# Patient Record
Sex: Female | Born: 1975 | Race: Black or African American | Hispanic: No | Marital: Single | Smoking: Current every day smoker
Health system: Southern US, Community
[De-identification: ages and names within clinical notes are randomized; demographics above are authoritative.]

## PROBLEM LIST (undated history)

## (undated) DIAGNOSIS — J449 Chronic obstructive pulmonary disease, unspecified: Secondary | ICD-10-CM

## (undated) DIAGNOSIS — E119 Type 2 diabetes mellitus without complications: Secondary | ICD-10-CM

---

## 2017-09-26 ENCOUNTER — Encounter (HOSPITAL_BASED_OUTPATIENT_CLINIC_OR_DEPARTMENT_OTHER): Payer: Self-pay

## 2017-09-26 ENCOUNTER — Emergency Department (HOSPITAL_BASED_OUTPATIENT_CLINIC_OR_DEPARTMENT_OTHER)
Admission: EM | Admit: 2017-09-26 | Discharge: 2017-09-26 | Disposition: A | Discharge status: Home / Self Care | Payer: Self-pay | Attending: Emergency Medicine | Admitting: Emergency Medicine

## 2017-09-26 ENCOUNTER — Emergency Department (HOSPITAL_BASED_OUTPATIENT_CLINIC_OR_DEPARTMENT_OTHER): Payer: Self-pay

## 2017-09-26 ENCOUNTER — Other Ambulatory Visit: Payer: Self-pay

## 2017-09-26 DIAGNOSIS — R05 Cough: Secondary | ICD-10-CM | POA: Insufficient documentation

## 2017-09-26 DIAGNOSIS — J209 Acute bronchitis, unspecified: Secondary | ICD-10-CM | POA: Insufficient documentation

## 2017-09-26 DIAGNOSIS — R059 Cough, unspecified: Secondary | ICD-10-CM

## 2017-09-26 DIAGNOSIS — F17200 Nicotine dependence, unspecified, uncomplicated: Secondary | ICD-10-CM | POA: Insufficient documentation

## 2017-09-26 MED ORDER — AZITHROMYCIN 250 MG PO TABS: 250.0000 mg | ORAL_TABLET | Freq: Every day | ORAL | 0 refills | Status: DC

## 2017-09-26 MED ORDER — PREDNISONE 10 MG PO TABS
60.0000 mg | ORAL_TABLET | Freq: Once | ORAL | Status: AC
  Administered 2017-09-26: 60 mg via ORAL
  Filled 2017-09-26: qty 1

## 2017-09-26 MED ORDER — IPRATROPIUM-ALBUTEROL 0.5-2.5 (3) MG/3ML IN SOLN
3.0000 mL | Freq: Once | RESPIRATORY_TRACT | Status: AC
  Administered 2017-09-26: 3 mL via RESPIRATORY_TRACT
  Filled 2017-09-26: qty 3

## 2017-09-26 MED ORDER — ALBUTEROL SULFATE (2.5 MG/3ML) 0.083% IN NEBU
5.0000 mg | INHALATION_SOLUTION | Freq: Once | RESPIRATORY_TRACT | Status: AC
  Administered 2017-09-26: 5 mg via RESPIRATORY_TRACT
  Filled 2017-09-26: qty 6

## 2017-09-26 MED ORDER — ALBUTEROL SULFATE HFA 108 (90 BASE) MCG/ACT IN AERS
2.0000 | INHALATION_SPRAY | Freq: Once | RESPIRATORY_TRACT | Status: AC
  Administered 2017-09-26: 2 via RESPIRATORY_TRACT
  Filled 2017-09-26: qty 6.7

## 2017-09-26 MED ORDER — PREDNISONE 10 MG (21) PO TBPK: ORAL_TABLET | ORAL | 0 refills | Status: DC

## 2017-09-26 NOTE — ED Provider Notes (Signed)
[facility] EMERGENCY DEPARTMENT Provider Note   CSN: [id number] Arrival date & time: 09/26/17  2015     History   Chief Complaint Chief Complaint  Patient presents with  . Cough    HPI Grace Collier is a 42 y.o. female.  HPI   Grace Collier is a 42 y.o. female, patient with no pertinent past medical history, presenting to the ED with cough beginning about 6 days ago.  Accompanied by intermittent shortness of breath and subjective fever.  Has intermittently taken Mucinex, but without improvement.  She does admit to poor hydration. Denies chest pain, abdominal pain, N/V/D, or any other complaints.   History reviewed. No pertinent past medical history.  There are no active problems to display for this patient.   History reviewed. No pertinent surgical history.   OB History   None      Home Medications    Prior to Admission medications   Medication Sig Start Date End Date Taking? Authorizing Provider  azithromycin (ZITHROMAX) 250 MG tablet Take 1 tablet (250 mg total) by mouth daily. Take first 2 tablets together, then 1 every day until finished. 09/26/17   Joy, Shawn C, PA-C  predniSONE (STERAPRED UNI-PAK 21 TAB) 10 MG (21) TBPK tablet Take 6 tabs (60mg ) on day 1, 5 tabs (50mg ) on day 2, 4 tabs (40mg ) on day 3, 3 tabs (30mg ) on day 4, 2 tabs (20mg ) on day 5, and 1 tab (10mg ) on day 6. 09/26/17   Joy, Hillard Danker, PA-C    Family History No family history on file.  Social History Social History   Tobacco Use  . Smoking status: Current Every Day Smoker  . Smokeless tobacco: Never Used  Substance Use Topics  . Alcohol use: Never    Frequency: Never  . Drug use: Not Currently     Allergies   Patient has no known allergies.   Review of Systems Review of Systems  Constitutional: Positive for fever (subjective).  HENT: Positive for congestion.   Respiratory: Positive for cough and shortness of breath.   Cardiovascular: Negative for chest pain.    Gastrointestinal: Negative for abdominal pain, diarrhea, nausea and vomiting.  Neurological: Negative for dizziness and light-headedness.  All other systems reviewed and are negative.    Physical Exam Updated Vital Signs BP (!) 168/106 (BP Location: Left Arm)   Pulse (!) 102   Temp 98.6 F (37 C) (Oral)   Resp (!) 24   Ht 5\' 2"  (1.575 m)   Wt 82.1 kg (181 lb)   LMP  (LMP Unknown)   SpO2 93%   BMI 33.11 kg/m   Physical Exam  Constitutional: She appears well-developed and well-nourished. No distress.  HENT:  Head: Normocephalic and atraumatic.  Eyes: Conjunctivae are normal.  Neck: Neck supple.  Cardiovascular: Normal rate, regular rhythm, normal heart sounds and intact distal pulses.  Pulmonary/Chest: She has decreased breath sounds. She has wheezes (global, expiratory).  No increased work of breathing.  Speaks full sentences without difficulty.  Abdominal: Soft. There is no tenderness. There is no guarding.  Musculoskeletal: She exhibits no edema.  Lymphadenopathy:    She has no cervical adenopathy.  Neurological: She is alert.  Skin: Skin is warm and dry. She is not diaphoretic.  Psychiatric: She has a normal mood and affect. Her behavior is normal.  Nursing note and vitals reviewed.    ED Treatments / Results  Labs (all labs ordered are listed, but only abnormal results are displayed) Labs Reviewed -  No data to display  EKG None  Radiology Dg Chest 2 View  Result Date: 09/26/2017 CLINICAL DATA:  Cough with shortness of breath EXAM: CHEST - 2 VIEW COMPARISON:  None. FINDINGS: No acute airspace disease or effusion. Mild cardiomegaly. No pneumothorax. IMPRESSION: No active cardiopulmonary disease.  Mild cardiomegaly. Electronically Signed   By: Jasmine PangKim  Fujinaga M.D.   On: 09/26/2017 22:13    Procedures Procedures (including critical care time)  Medications Ordered in ED Medications  albuterol (PROVENTIL) (2.5 MG/3ML) 0.083% nebulizer solution 5 mg (5 mg  Nebulization Given 09/26/17 2037)  predniSONE (DELTASONE) tablet 60 mg (60 mg Oral Given 09/26/17 2101)  ipratropium-albuterol (DUONEB) 0.5-2.5 (3) MG/3ML nebulizer solution 3 mL (3 mLs Nebulization Given 09/26/17 2102)  albuterol (PROVENTIL HFA;VENTOLIN HFA) 108 (90 Base) MCG/ACT inhaler 2 puff (2 puffs Inhalation Given 09/26/17 2309)     Initial Impression / Assessment and Plan / ED Course  I have reviewed the triage vital signs and the nursing notes.  Pertinent labs & imaging results that were available during my care of the patient were reviewed by me and considered in my medical decision making (see chart for details).  Clinical Course as of Sep 28 26  Fri Sep 26, 2017  2124 Patient states she feels much better.  SPO2 noted to be 95% on room air.   [SJ]  2247 Patient states she continues to feel much better.  SPO2 99% on room air.  No increased work of breathing.  No wheezes on lung exam.   [SJ]    Clinical Course User Index [SJ] Joy, Shawn C, PA-C    Patient presents with cough and intermittent shortness of breath.  No acute abnormality on chest x-ray.  Patient showed significant improvement during ED course.  It should be noted that despite the reported SPO2 readings in the low 90s, patient showed no signs of distress.  Patient ambulated without shortness of breath or signs of distress. The patient was given instructions for home care as well as return precautions. Patient voices understanding of these instructions, accepts the plan, and is comfortable with discharge.  Final Clinical Impressions(s) / ED Diagnoses   Final diagnoses:  Cough  Acute bronchitis, unspecified organism    ED Discharge Orders        Ordered    predniSONE (STERAPRED UNI-PAK 21 TAB) 10 MG (21) TBPK tablet     09/26/17 2254    azithromycin (ZITHROMAX) 250 MG tablet  Daily     09/26/17 2254       Anselm PancoastJoy, Shawn C, PA-C 09/27/17 0030    Charlynne PanderYao, David Hsienta, MD 09/27/17 1504

## 2017-09-26 NOTE — ED Triage Notes (Signed)
C/o cough x 6 days-NAD-steady gait

## 2017-09-26 NOTE — Discharge Instructions (Signed)
Please take all of your antibiotics until finished!   You may develop abdominal discomfort or diarrhea from the antibiotic.  You may help offset this with probiotics which you can buy or get in yogurt. Do not eat or take the probiotics until 2 hours after your antibiotic.   Hand washing: Wash your hands throughout the day, but especially before and after touching the face, using the restroom, sneezing, coughing, or touching surfaces that have been coughed or sneezed upon. Hydration: Symptoms will be intensified and complicated by dehydration. Dehydration can also extend the duration of symptoms. Drink plenty of fluids and get plenty of rest. You should be drinking at least half a liter of water an hour to stay hydrated. Electrolyte drinks (ex. Gatorade, Powerade, Pedialyte) are also encouraged. You should be drinking enough fluids to make your urine light yellow, almost clear. If this is not the case, you are not drinking enough water. Please note that some of the treatments indicated below will not be effective if you are not adequately hydrated. Pain or fever: Ibuprofen, Naproxen, or Tylenol for pain or fever.  Albuterol: May use the albuterol as needed for instances of shortness of breath. Prednisone: Take the prednisone, as directed, in its entirety. Zyrtec or Claritin: May add these medication daily to control underlying symptoms of congestion, sneezing, and other signs of allergies. Flonase: Use this medication, as directed, for nasal and sinus congestion. Congestion: Plain Mucinex may help relieve congestion. Saline sinus rinses and saline nasal sprays may also help relieve congestion. Follow up: Follow up with a primary care provider, as needed, for any future management of this issue. Return: Return to the ED for any worsening symptoms.

## 2017-09-26 NOTE — ED Notes (Signed)
Pt states she has had cold symptoms that started last Saturday and have progressively gotten worse  Pt states she has taken mucinex and theraflu without relief

## 2017-11-18 DIAGNOSIS — I10 Essential (primary) hypertension: Secondary | ICD-10-CM | POA: Diagnosis present

## 2017-11-19 DIAGNOSIS — Z87891 Personal history of nicotine dependence: Secondary | ICD-10-CM

## 2017-11-19 DIAGNOSIS — J449 Chronic obstructive pulmonary disease, unspecified: Secondary | ICD-10-CM | POA: Diagnosis present

## 2017-11-20 DIAGNOSIS — R911 Solitary pulmonary nodule: Secondary | ICD-10-CM | POA: Diagnosis present

## 2017-12-10 DIAGNOSIS — E119 Type 2 diabetes mellitus without complications: Secondary | ICD-10-CM

## 2019-09-17 ENCOUNTER — Emergency Department (HOSPITAL_BASED_OUTPATIENT_CLINIC_OR_DEPARTMENT_OTHER): Payer: Self-pay

## 2019-09-17 ENCOUNTER — Other Ambulatory Visit: Payer: Self-pay

## 2019-09-17 ENCOUNTER — Inpatient Hospital Stay (HOSPITAL_COMMUNITY): Payer: Self-pay

## 2019-09-17 ENCOUNTER — Encounter (HOSPITAL_BASED_OUTPATIENT_CLINIC_OR_DEPARTMENT_OTHER): Payer: Self-pay

## 2019-09-17 ENCOUNTER — Inpatient Hospital Stay (HOSPITAL_BASED_OUTPATIENT_CLINIC_OR_DEPARTMENT_OTHER)
Admission: AD | Admit: 2019-09-17 | Discharge: 2019-09-20 | DRG: 812 | Disposition: A | Discharge status: Home / Self Care | Payer: Self-pay | Attending: Internal Medicine | Admitting: Internal Medicine

## 2019-09-17 DIAGNOSIS — K5900 Constipation, unspecified: Secondary | ICD-10-CM | POA: Diagnosis present

## 2019-09-17 DIAGNOSIS — D509 Iron deficiency anemia, unspecified: Principal | ICD-10-CM | POA: Diagnosis present

## 2019-09-17 DIAGNOSIS — Z87891 Personal history of nicotine dependence: Secondary | ICD-10-CM

## 2019-09-17 DIAGNOSIS — D649 Anemia, unspecified: Secondary | ICD-10-CM | POA: Diagnosis present

## 2019-09-17 DIAGNOSIS — Z20822 Contact with and (suspected) exposure to covid-19: Secondary | ICD-10-CM | POA: Diagnosis present

## 2019-09-17 DIAGNOSIS — Z79899 Other long term (current) drug therapy: Secondary | ICD-10-CM

## 2019-09-17 DIAGNOSIS — I10 Essential (primary) hypertension: Secondary | ICD-10-CM | POA: Diagnosis present

## 2019-09-17 DIAGNOSIS — F17200 Nicotine dependence, unspecified, uncomplicated: Secondary | ICD-10-CM | POA: Diagnosis present

## 2019-09-17 DIAGNOSIS — K8020 Calculus of gallbladder without cholecystitis without obstruction: Secondary | ICD-10-CM | POA: Diagnosis present

## 2019-09-17 DIAGNOSIS — R140 Abdominal distension (gaseous): Secondary | ICD-10-CM

## 2019-09-17 DIAGNOSIS — E119 Type 2 diabetes mellitus without complications: Secondary | ICD-10-CM

## 2019-09-17 DIAGNOSIS — R911 Solitary pulmonary nodule: Secondary | ICD-10-CM | POA: Diagnosis present

## 2019-09-17 DIAGNOSIS — J449 Chronic obstructive pulmonary disease, unspecified: Secondary | ICD-10-CM | POA: Diagnosis present

## 2019-09-17 DIAGNOSIS — R1010 Upper abdominal pain, unspecified: Secondary | ICD-10-CM

## 2019-09-17 DIAGNOSIS — J90 Pleural effusion, not elsewhere classified: Secondary | ICD-10-CM

## 2019-09-17 DIAGNOSIS — D696 Thrombocytopenia, unspecified: Secondary | ICD-10-CM | POA: Diagnosis present

## 2019-09-17 HISTORY — DX: Chronic obstructive pulmonary disease, unspecified: J44.9

## 2019-09-17 HISTORY — DX: Type 2 diabetes mellitus without complications: E11.9

## 2019-09-17 LAB — PREPARE RBC (CROSSMATCH)

## 2019-09-17 LAB — COMPREHENSIVE METABOLIC PANEL
ALT: 26 U/L (ref 0–44)
AST: 26 U/L (ref 15–41)
Albumin: 3.5 g/dL (ref 3.5–5.0)
Alkaline Phosphatase: 66 U/L (ref 38–126)
Anion gap: 8 (ref 5–15)
BUN: 7 mg/dL (ref 6–20)
CO2: 25 mmol/L (ref 22–32)
Calcium: 8.8 mg/dL — ABNORMAL LOW (ref 8.9–10.3)
Chloride: 106 mmol/L (ref 98–111)
Creatinine, Ser: 0.86 mg/dL (ref 0.44–1.00)
GFR calc Af Amer: >60 mL/min (ref >60)
GFR calc non Af Amer: >60 mL/min (ref >60)
Glucose, Bld: 206 mg/dL — ABNORMAL HIGH (ref 70–99)
Potassium: 3.6 mmol/L (ref 3.5–5.1)
Sodium: 139 mmol/L (ref 135–145)
Total Bilirubin: 0.4 mg/dL (ref 0.3–1.2)
Total Protein: 6.6 g/dL (ref 6.5–8.1)

## 2019-09-17 LAB — FOLATE: Folate: 7.6 ng/mL (ref >5.9)

## 2019-09-17 LAB — CBC
HCT: 24.7 % — ABNORMAL LOW (ref 36.0–46.0)
Hemoglobin: 6.7 g/dL — CL (ref 12.0–15.0)
MCH: 19.3 pg — ABNORMAL LOW (ref 26.0–34.0)
MCHC: 27.1 g/dL — ABNORMAL LOW (ref 30.0–36.0)
MCV: 71.2 fL — ABNORMAL LOW (ref 80.0–100.0)
Platelets: 148 10*3/uL — ABNORMAL LOW (ref 150–400)
RBC: 3.47 MIL/uL — ABNORMAL LOW (ref 3.87–5.11)
RDW: 19.1 % — ABNORMAL HIGH (ref 11.5–15.5)
WBC: 5.1 10*3/uL (ref 4.0–10.5)
nRBC: 0 % (ref 0.0–0.2)

## 2019-09-17 LAB — IRON AND TIBC
Iron: 19 ug/dL — ABNORMAL LOW (ref 28–170)
Saturation Ratios: 4 % — ABNORMAL LOW (ref 10.4–31.8)
TIBC: 534 ug/dL — ABNORMAL HIGH (ref 250–450)
UIBC: 515 ug/dL

## 2019-09-17 LAB — SARS CORONAVIRUS 2 BY RT PCR (HOSPITAL ORDER, PERFORMED IN ~~LOC~~ HOSPITAL LAB): SARS Coronavirus 2: NEGATIVE

## 2019-09-17 LAB — URINALYSIS, ROUTINE W REFLEX MICROSCOPIC
Bilirubin Urine: NEGATIVE
Glucose, UA: 100 mg/dL — AB
Hgb urine dipstick: NEGATIVE
Ketones, ur: NEGATIVE mg/dL
Leukocytes,Ua: NEGATIVE
Nitrite: NEGATIVE
Protein, ur: NEGATIVE mg/dL
Specific Gravity, Urine: 1.02 (ref 1.005–1.030)
pH: 6 (ref 5.0–8.0)

## 2019-09-17 LAB — GLUCOSE, CAPILLARY: Glucose-Capillary: 139 mg/dL — ABNORMAL HIGH (ref 70–99)

## 2019-09-17 LAB — RETICULOCYTES
Immature Retic Fract: 12.9 % (ref 2.3–15.9)
RBC.: 3.36 MIL/uL — ABNORMAL LOW (ref 3.87–5.11)
Retic Count, Absolute: 56.8 10*3/uL (ref 19.0–186.0)
Retic Ct Pct: 1.7 % (ref 0.4–3.1)

## 2019-09-17 LAB — TSH: TSH: 0.741 u[IU]/mL (ref 0.350–4.500)

## 2019-09-17 LAB — FERRITIN: Ferritin: 3 ng/mL — ABNORMAL LOW (ref 11–307)

## 2019-09-17 LAB — VITAMIN B12: Vitamin B-12: 355 pg/mL (ref 180–914)

## 2019-09-17 LAB — HEMOGLOBIN A1C
Hgb A1c MFr Bld: 6.7 % — ABNORMAL HIGH (ref 4.8–5.6)
Mean Plasma Glucose: 145.59 mg/dL

## 2019-09-17 LAB — LIPASE, BLOOD: Lipase: 49 U/L (ref 11–51)

## 2019-09-17 LAB — PREGNANCY, URINE: Preg Test, Ur: NEGATIVE

## 2019-09-17 LAB — OCCULT BLOOD X 1 CARD TO LAB, STOOL: Fecal Occult Bld: NEGATIVE

## 2019-09-17 MED ORDER — THIAMINE HCL 100 MG PO TABS
100.0000 mg | ORAL_TABLET | Freq: Every day | ORAL | Status: DC
  Administered 2019-09-17 – 2019-09-20 (×3): 100 mg via ORAL
  Filled 2019-09-17 (×3): qty 1

## 2019-09-17 MED ORDER — SODIUM CHLORIDE 0.9% FLUSH
3.0000 mL | INTRAVENOUS | Status: DC | PRN
  Administered 2019-09-18: 3 mL via INTRAVENOUS

## 2019-09-17 MED ORDER — SODIUM CHLORIDE 0.9% FLUSH
3.0000 mL | Freq: Two times a day (BID) | INTRAVENOUS | Status: DC
  Administered 2019-09-17 – 2019-09-20 (×3): 3 mL via INTRAVENOUS

## 2019-09-17 MED ORDER — PROMETHAZINE HCL 25 MG PO TABS: 12.5000 mg | ORAL_TABLET | Freq: Four times a day (QID) | ORAL | Status: DC | PRN

## 2019-09-17 MED ORDER — METOPROLOL TARTRATE 5 MG/5ML IV SOLN: 5.0000 mg | Freq: Four times a day (QID) | INTRAVENOUS | Status: DC | PRN

## 2019-09-17 MED ORDER — ACETAMINOPHEN 325 MG PO TABS
650.0000 mg | ORAL_TABLET | Freq: Once | ORAL | Status: AC
  Administered 2019-09-18: 650 mg via ORAL
  Filled 2019-09-17: qty 2

## 2019-09-17 MED ORDER — OXYCODONE HCL 5 MG PO TABS
5.0000 mg | ORAL_TABLET | ORAL | Status: DC | PRN
  Administered 2019-09-18 – 2019-09-20 (×4): 5 mg via ORAL
  Filled 2019-09-17 (×4): qty 1

## 2019-09-17 MED ORDER — ACETAMINOPHEN 325 MG PO TABS: 650.0000 mg | ORAL_TABLET | Freq: Four times a day (QID) | ORAL | Status: DC | PRN

## 2019-09-17 MED ORDER — SENNOSIDES-DOCUSATE SODIUM 8.6-50 MG PO TABS: 1.0000 | ORAL_TABLET | Freq: Every evening | ORAL | Status: DC | PRN

## 2019-09-17 MED ORDER — ADULT MULTIVITAMIN W/MINERALS CH
1.0000 | ORAL_TABLET | Freq: Every day | ORAL | Status: DC
  Administered 2019-09-17 – 2019-09-20 (×3): 1 via ORAL
  Filled 2019-09-17 (×3): qty 1

## 2019-09-17 MED ORDER — NICOTINE 14 MG/24HR TD PT24
14.0000 mg | MEDICATED_PATCH | Freq: Every day | TRANSDERMAL | Status: DC
  Administered 2019-09-17 – 2019-09-20 (×4): 14 mg via TRANSDERMAL
  Filled 2019-09-17 (×4): qty 1

## 2019-09-17 MED ORDER — INSULIN ASPART 100 UNIT/ML ~~LOC~~ SOLN: 0.0000 [IU] | Freq: Every day | SUBCUTANEOUS | Status: DC

## 2019-09-17 MED ORDER — INSULIN ASPART 100 UNIT/ML ~~LOC~~ SOLN
0.0000 [IU] | Freq: Three times a day (TID) | SUBCUTANEOUS | Status: DC
  Administered 2019-09-20: 4 [IU] via SUBCUTANEOUS

## 2019-09-17 MED ORDER — INSULIN ASPART 100 UNIT/ML ~~LOC~~ SOLN
4.0000 [IU] | Freq: Three times a day (TID) | SUBCUTANEOUS | Status: DC
  Administered 2019-09-20 (×2): 4 [IU] via SUBCUTANEOUS

## 2019-09-17 MED ORDER — FOLIC ACID 1 MG PO TABS
1.0000 mg | ORAL_TABLET | Freq: Every day | ORAL | Status: DC
  Administered 2019-09-17 – 2019-09-20 (×3): 1 mg via ORAL
  Filled 2019-09-17 (×3): qty 1

## 2019-09-17 MED ORDER — SODIUM CHLORIDE 0.9 % IV SOLN
250.0000 mL | INTRAVENOUS | Status: DC | PRN
  Administered 2019-09-18: 100 mL via INTRAVENOUS
  Administered 2019-09-18: 250 mL via INTRAVENOUS

## 2019-09-17 MED ORDER — PANTOPRAZOLE SODIUM 40 MG IV SOLR
40.0000 mg | Freq: Once | INTRAVENOUS | Status: AC
  Administered 2019-09-17: 40 mg via INTRAVENOUS
  Filled 2019-09-17: qty 40

## 2019-09-17 MED ORDER — DIPHENHYDRAMINE HCL 25 MG PO CAPS
25.0000 mg | ORAL_CAPSULE | Freq: Once | ORAL | Status: AC
  Administered 2019-09-18: 25 mg via ORAL
  Filled 2019-09-17: qty 1

## 2019-09-17 MED ORDER — ACETAMINOPHEN 650 MG RE SUPP: 650.0000 mg | Freq: Four times a day (QID) | RECTAL | Status: DC | PRN

## 2019-09-17 MED ORDER — TRAZODONE HCL 50 MG PO TABS: 25.0000 mg | ORAL_TABLET | Freq: Every evening | ORAL | Status: DC | PRN

## 2019-09-17 MED ORDER — INSULIN DETEMIR 100 UNIT/ML ~~LOC~~ SOLN
5.0000 [IU] | Freq: Every day | SUBCUTANEOUS | Status: DC
  Administered 2019-09-17 – 2019-09-19 (×3): 5 [IU] via SUBCUTANEOUS
  Filled 2019-09-17 (×4): qty 0.05

## 2019-09-17 MED ORDER — SODIUM CHLORIDE 0.9% IV SOLUTION: Freq: Once | INTRAVENOUS | Status: AC

## 2019-09-17 NOTE — H&P (Signed)
History and Physical    Grace Collier EML:[id number] DOB: 1975-04-15 DOA: 09/17/2019  PCP: Patient, No Pcp Per  Patient coming from: Home  I have personally briefly reviewed patient's old medical records in [place]  Chief Complaint: Abdominal pain that is worse with eating  HPI: Grace Collier is a 44 y.o. female with medical history significant of hypertension, diabetes type 2, COPD who comes in with abdominal pain that seems to be worse with eating.  She came to the [facility] ER on 6/10 with similar complaints but left prior to completing work-up due to the time it took to be seen.  She reports pain in her epigastrium for the last week it seems to be worse at night and after eating.  She has been using Goody powder for the pain.  She has had nausea and a few episodes of emesis.  She denies any hematemesis she has been constipated.  Her last BM was 4 days ago she does not know blood in her stool.  She denies heavy vaginal bleeding reporting that her cycles last 3 to 4 days and are not heavy and are monthly.  She denies melena or hematochezia.  She is having fatigue shortness of breath with exertion and weakness.  She is also craving ice.   ED Course: At the [facility] was noted to have cholelithiasis on right upper quadrant ultrasound and a hemoglobin of 6.7 she was referred over for admission.  Review of Systems: As per HPI otherwise 10 point review of systems negative.   Past Medical History:  Diagnosis Date  . COPD (chronic obstructive pulmonary disease) ([place])   . Diabetes mellitus without complication ([place])     History reviewed. No pertinent surgical history.   reports that she has been smoking. She has never used smokeless tobacco. She reports previous drug use. She reports that she does not drink alcohol.  No Known Allergies  No family history on file.   Prior to Admission medications   Medication Sig Start Date End Date Taking? Authorizing Provider    azithromycin (ZITHROMAX) 250 MG tablet Take 1 tablet (250 mg total) by mouth daily. Take first 2 tablets together, then 1 every day until finished. 09/26/17   Joy, Shawn C, PA-C  predniSONE (STERAPRED UNI-PAK 21 TAB) 10 MG (21) TBPK tablet Take 6 tabs (60mg ) on day 1, 5 tabs (50mg ) on day 2, 4 tabs (40mg ) on day 3, 3 tabs (30mg ) on day 4, 2 tabs (20mg ) on day 5, and 1 tab (10mg ) on day 6. 09/26/17   Lorayne Bender, PA-C    Physical Exam: Vitals:   09/17/19 1551 09/17/19 1816 09/17/19 1925 09/17/19 2035  BP: (!) 151/96 (!) 147/86 (!) 159/87 (!) 155/85  Pulse: 76 82 81 80  Resp: 18 18 16 16   Temp:   98.1 F (36.7 C) 98.7 F (37.1 C)  TempSrc:   Oral Oral  SpO2: 96% 94% 95% 96%  Weight:    89.9 kg  Height:    5\' 2"  (1.575 m)    Constitutional: NAD, calm, comfortable Vitals:   09/17/19 1551 09/17/19 1816 09/17/19 1925 09/17/19 2035  BP: (!) 151/96 (!) 147/86 (!) 159/87 (!) 155/85  Pulse: 76 82 81 80  Resp: 18 18 16 16   Temp:   98.1 F (36.7 C) 98.7 F (37.1 C)  TempSrc:   Oral Oral  SpO2: 96% 94% 95% 96%  Weight:    89.9 kg  Height:    5'  2" (1.575 m)   Eyes: PERRL, lids and conjunctivae normal ENMT: Mucous membranes are moist. Posterior pharynx clear of any exudate or lesions.Normal dentition.  Neck: normal, supple, no masses, no thyromegaly Respiratory: clear to auscultation bilaterally, no wheezing, no crackles. Normal respiratory effort. No accessory muscle use.  Cardiovascular: Regular rate and rhythm, no murmurs / rubs / gallops. No extremity edema. 2+ pedal pulses. No carotid bruits.  Abdomen: no tenderness, no masses palpated. No hepatosplenomegaly. Bowel sounds positive.  Musculoskeletal: no clubbing / cyanosis. No joint deformity upper and lower extremities. Good ROM, no contractures. Normal muscle tone.  Skin: no rashes, lesions, ulcers. No induration Neurologic: Grossly normal.  Psychiatric: Normal judgment and insight. Alert and oriented x 3. Normal mood.   Labs on  Admission: I have personally reviewed following labs and imaging studies  CBC: Recent Labs  Lab 09/17/19 1355  WBC 5.1  HGB 6.7*  HCT 24.7*  MCV 71.2*  PLT 148*   Basic Metabolic Panel: Recent Labs  Lab 09/17/19 1355  NA 139  K 3.6  CL 106  CO2 25  GLUCOSE 206*  BUN 7  CREATININE 0.86  CALCIUM 8.8*   GFR: Estimated Creatinine Clearance: 87 mL/min (by C-G formula based on SCr of 0.86 mg/dL). Liver Function Tests: Recent Labs  Lab 09/17/19 1355  AST 26  ALT 26  ALKPHOS 66  BILITOT 0.4  PROT 6.6  ALBUMIN 3.5   Recent Labs  Lab 09/17/19 1355  LIPASE 49   Urine analysis:    Component Value Date/Time   COLORURINE YELLOW 09/17/2019 1355   APPEARANCEUR HAZY (A) 09/17/2019 1355   LABSPEC 1.020 09/17/2019 1355   PHURINE 6.0 09/17/2019 1355   GLUCOSEU 100 (A) 09/17/2019 1355   HGBUR NEGATIVE 09/17/2019 1355   BILIRUBINUR NEGATIVE 09/17/2019 1355   KETONESUR NEGATIVE 09/17/2019 1355   PROTEINUR NEGATIVE 09/17/2019 1355   NITRITE NEGATIVE 09/17/2019 1355   LEUKOCYTESUR NEGATIVE 09/17/2019 1355    Radiological Exams on Admission: US Abdomen Complete  Result Date: 09/17/2019 CLINICAL DATA:  Epigastric region pain EXAM: ABDOMEN ULTRASOUND COMPLETE COMPARISON:  None. FINDINGS: Gallbladder: There are mobile echogenic foci in the gallbladder which shadow weakly consistent with cholelithiasis. Largest gallstone measures 5 mm in length. There is no appreciable gallbladder wall thickening or pericholecystic fluid. No sonographic Murphy sign noted by sonographer. Common bile duct: Diameter: 2 mm. No intrahepatic, common hepatic, or common bile duct dilatation. Liver: No focal lesion identified. Within normal limits in parenchymal echogenicity. Portal vein is patent on color Doppler imaging with normal direction of blood flow towards the liver. IVC: No abnormality visualized. Pancreas: No pancreatic mass or inflammatory focus. Spleen: Size and appearance within normal limits.  Right Kidney: Length: 10.1 cm. Echogenicity within normal limits. No mass or hydronephrosis visualized. Left Kidney: Length: 10.6 cm. Echogenicity within normal limits. No mass or hydronephrosis visualized. Abdominal aorta: No aneurysm visualized. Other findings: No ascites evident. Apparent small right pleural effusion. IMPRESSION: 1. Cholelithiasis. No gallbladder wall thickening or pericholecystic fluid. 2.  Apparent small right pleural effusion. 3.  Study otherwise unremarkable. Electronically Signed   By: Bretta Bang III M.D.   On: 09/17/2019 17:22    Assessment/Plan Principal Problem:   Symptomatic anemia Active Problems:   Upper abdominal pain   COPD (chronic obstructive pulmonary disease) (HCC)   Essential hypertension   History of tobacco abuse   Left upper lobe pulmonary nodule   Type 2 diabetes mellitus, without long-term current use of insulin (HCC)  Symptomatic anemia: Anemia  work-up.  Transfuse 1 unit packed red blood cells.  Thrombocytopenia: Unclear etiology  Pleural effusion: We will obtain chest x-ray  Type 2 diabetes mellitus: Monitor CBGs Sliding scale insulin We will need primary care referral post admission  Hypertension: We will monitor BPs here.  Metoprolol as needed.  May need ongoing blood pressure control once discharged  Cholelithiasis: No obvious infection at this point.  Low-fat diet.  Ambulatory referral to surgery for removal.  Patient is without health insurance at this point.  DVT prophylaxis: SCD/Compression stockings Code Status: Full code  Family Communication: Patient at bedside Disposition Plan: Home Consults called: None Admission status: Observation   Reva Bores MD [facility]  If 7PM-7AM, please contact night-coverage 09/17/2019, 8:51 PM

## 2019-09-17 NOTE — ED Provider Notes (Signed)
[place] EMERGENCY DEPARTMENT Provider Note   CSN: [id number] Arrival date & time: 09/17/19  1335     History Chief Complaint  Patient presents with  . Abdominal Pain    Grace Collier is a 44 y.o. female.  Patient with history of diabetes, microcytic anemia (baseline appears to be hemoglobin 9-10 from 2016-2019) --presents the emergency department for upper abdominal pain that has been going on for about a week or so.  Patient describes pain in the epigastrium that is worse at night and with eating.  Over the past 3 to 4 days she has taken several doses of Goody powder for the pain without improvement.  She has had nausea and sparse episodes of vomiting.  She denies hematemesis.  She has been constipated with her last bowel movement 4 days ago.  She has not noted any blood in the stool.  She has been on iron in the past but not currently.  She denies overly heavy vaginal bleeding.  Typically she bleeds for 3 to 4 days, no clots.  No active vaginal bleeding.  She has not noted melena or blood in the stool.  She does not have a history of GERD or peptic ulcer disease.  She has never required a blood transfusion.  She does report ongoing fatigue, shortness of breath with exertion, generalized weakness.  She states that she craves ice.        Past Medical History:  Diagnosis Date  . COPD (chronic obstructive pulmonary disease) ([place])   . Diabetes mellitus without complication ([place])     There are no problems to display for this patient.   History reviewed. No pertinent surgical history.   OB History   No obstetric history on file.     No family history on file.  Social History   Tobacco Use  . Smoking status: Current Every Day Smoker  . Smokeless tobacco: Never Used  Vaping Use  . Vaping Use: Never used  Substance Use Topics  . Alcohol use: Never  . Drug use: Not Currently    Home Medications Prior to Admission medications   Medication Sig Start Date End  Date Taking? Authorizing Provider  azithromycin (ZITHROMAX) 250 MG tablet Take 1 tablet (250 mg total) by mouth daily. Take first 2 tablets together, then 1 every day until finished. 09/26/17   Joy, Shawn C, PA-C  predniSONE (STERAPRED UNI-PAK 21 TAB) 10 MG (21) TBPK tablet Take 6 tabs (60mg ) on day 1, 5 tabs (50mg ) on day 2, 4 tabs (40mg ) on day 3, 3 tabs (30mg ) on day 4, 2 tabs (20mg ) on day 5, and 1 tab (10mg ) on day 6. 09/26/17   Joy, Shawn C, PA-C    Allergies    Patient has no known allergies.  Review of Systems   Review of Systems  Constitutional: Positive for fatigue. Negative for fever.  HENT: Negative for rhinorrhea and sore throat.   Eyes: Negative for redness.  Respiratory: Positive for shortness of breath. Negative for cough.   Cardiovascular: Negative for chest pain.  Gastrointestinal: Positive for abdominal pain, constipation, nausea and vomiting. Negative for blood in stool and diarrhea.  Genitourinary: Negative for dysuria, vaginal bleeding and vaginal discharge.  Musculoskeletal: Negative for myalgias.  Skin: Negative for rash.  Neurological: Positive for weakness. Negative for headaches.    Physical Exam Updated Vital Signs BP (!) 151/96 (BP Location: Right Arm)   Pulse 76   Temp 98.5 F (36.9 C) (Oral)   Resp 18  Ht 5\' 2"  (1.575 m)   Wt 90.7 kg   LMP 09/04/2019   SpO2 96%   BMI 36.58 kg/m   Physical Exam Vitals and nursing note reviewed. Exam conducted with a chaperone present.  Constitutional:      Appearance: She is well-developed.  HENT:     Head: Normocephalic and atraumatic.  Eyes:     General:        Right eye: No discharge.        Left eye: No discharge.     Conjunctiva/sclera: Conjunctivae normal.     Comments: Pale conjunctiva  Cardiovascular:     Rate and Rhythm: Normal rate and regular rhythm.     Heart sounds: Normal heart sounds.  Pulmonary:     Effort: Pulmonary effort is normal.     Breath sounds: Normal breath sounds.    Abdominal:     Palpations: Abdomen is soft.     Tenderness: There is abdominal tenderness (Mild upper abdominal tenderness without rebound or guarding.) in the right upper quadrant, epigastric area and left upper quadrant. There is no guarding or rebound. Negative signs include Murphy's sign and McBurney's sign.  Genitourinary:    Exam position: Knee-chest position.     Rectum: Guaiac result negative. No tenderness.  Musculoskeletal:     Cervical back: Normal range of motion and neck supple.  Skin:    General: Skin is warm and dry.  Neurological:     Mental Status: She is alert.     ED Results / Procedures / Treatments   Labs (all labs ordered are listed, but only abnormal results are displayed) Labs Reviewed  COMPREHENSIVE METABOLIC PANEL - Abnormal; Notable for the following components:      Result Value   Glucose, Bld 206 (*)    Calcium 8.8 (*)    All other components within normal limits  CBC - Abnormal; Notable for the following components:   RBC 3.47 (*)    Hemoglobin 6.7 (*)    HCT 24.7 (*)    MCV 71.2 (*)    MCH 19.3 (*)    MCHC 27.1 (*)    RDW 19.1 (*)    Platelets 148 (*)    All other components within normal limits  URINALYSIS, ROUTINE W REFLEX MICROSCOPIC - Abnormal; Notable for the following components:   APPearance HAZY (*)    Glucose, UA 100 (*)    All other components within normal limits  SARS CORONAVIRUS 2 BY RT PCR (HOSPITAL ORDER, PERFORMED IN [facility])  LIPASE, BLOOD  PREGNANCY, URINE  OCCULT BLOOD X 1 CARD TO LAB, STOOL    EKG None  Radiology 09/06/2019 Abdomen Complete  Result Date: 09/17/2019 CLINICAL DATA:  Epigastric region pain EXAM: ABDOMEN ULTRASOUND COMPLETE COMPARISON:  None. FINDINGS: Gallbladder: There are mobile echogenic foci in the gallbladder which shadow weakly consistent with cholelithiasis. Largest gallstone measures 5 mm in length. There is no appreciable gallbladder wall thickening or pericholecystic fluid. No  sonographic Murphy sign noted by sonographer. Common bile duct: Diameter: 2 mm. No intrahepatic, common hepatic, or common bile duct dilatation. Liver: No focal lesion identified. Within normal limits in parenchymal echogenicity. Portal vein is patent on color Doppler imaging with normal direction of blood flow towards the liver. IVC: No abnormality visualized. Pancreas: No pancreatic mass or inflammatory focus. Spleen: Size and appearance within normal limits. Right Kidney: Length: 10.1 cm. Echogenicity within normal limits. No mass or hydronephrosis visualized. Left Kidney: Length: 10.6 cm. Echogenicity within normal  limits. No mass or hydronephrosis visualized. Abdominal aorta: No aneurysm visualized. Other findings: No ascites evident. Apparent small right pleural effusion. IMPRESSION: 1. Cholelithiasis. No gallbladder wall thickening or pericholecystic fluid. 2.  Apparent small right pleural effusion. 3.  Study otherwise unremarkable. Electronically Signed   By: Bretta Bang III M.D.   On: 09/17/2019 17:22    Procedures Procedures (including critical care time)  Medications Ordered in ED Medications  pantoprazole (PROTONIX) injection 40 mg (40 mg Intravenous Given 09/17/19 1610)    ED Course  I have reviewed the triage vital signs and the nursing notes.  Pertinent labs & imaging results that were available during my care of the patient were reviewed by me and considered in my medical decision making (see chart for details).  Patient seen and examined.  Work-up reviewed.  She has what appears to be acute on chronic microcytic anemia.  She has had low iron in the past.  No active vaginal bleeding and she does not endorse routine heavy periods.  She has upper abdominal pain which is concerning for peptic ulcer disease or upper GI bleed, however BUN is normal and stool does not show any blood.  Rectal exam was performed after arrival with RN chaperone.  Suspect the patient has an element of  NSAID gastritis given her recent Goody powder use.  Will give dose of IV Protonix.  Will obtain abdominal ultrasound given her abdominal pain.  Plan for admission for blood transfusion and further work-up of anemia given that she is symptomatic from this.  Vital signs reviewed and are as follows: BP (!) 151/96 (BP Location: Right Arm)   Pulse 76   Temp 98.5 F (36.9 C) (Oral)   Resp 18   Ht 5\' 2"  (1.575 m)   Wt 90.7 kg   LMP 09/04/2019   SpO2 96%   BMI 36.58 kg/m   6:20 PM 09/06/2019 shows gallstones, no cholecystitis.   Discussed case with Dr. [place] who is on for [facility] tonight. Accepts for admission.     MDM Rules/Calculators/A&P                          Admit, stable, symptomatic anemia.   Final Clinical Impression(s) / ED Diagnoses Final diagnoses:  Symptomatic anemia  Upper abdominal pain    Rx / DC Orders ED Discharge Orders    None       Shawnie Pons, PA-C 09/17/19 1821    11/17/19, DO 09/17/19 1841

## 2019-09-17 NOTE — ED Notes (Signed)
Report given to: Gust Brooms, RN with [facility].

## 2019-09-17 NOTE — ED Notes (Signed)
This RN attempted to call report; RN is unavailable at present time and will call back for report.

## 2019-09-17 NOTE — ED Triage Notes (Signed)
Pt c/o abd pain that is worse after eating. Pt has associated vomiting and constipation.

## 2019-09-18 ENCOUNTER — Inpatient Hospital Stay (HOSPITAL_COMMUNITY): Payer: Self-pay

## 2019-09-18 LAB — COMPREHENSIVE METABOLIC PANEL WITH GFR
ALT: 25 U/L (ref 0–44)
AST: 22 U/L (ref 15–41)
Albumin: 3.6 g/dL (ref 3.5–5.0)
Alkaline Phosphatase: 66 U/L (ref 38–126)
Anion gap: 9 (ref 5–15)
BUN: 10 mg/dL (ref 6–20)
CO2: 26 mmol/L (ref 22–32)
Calcium: 9.3 mg/dL (ref 8.9–10.3)
Chloride: 105 mmol/L (ref 98–111)
Creatinine, Ser: 0.82 mg/dL (ref 0.44–1.00)
GFR calc Af Amer: >60 mL/min
GFR calc non Af Amer: >60 mL/min
Glucose, Bld: 132 mg/dL — ABNORMAL HIGH (ref 70–99)
Potassium: 4 mmol/L (ref 3.5–5.1)
Sodium: 140 mmol/L (ref 135–145)
Total Bilirubin: 0.5 mg/dL (ref 0.3–1.2)
Total Protein: 7 g/dL (ref 6.5–8.1)

## 2019-09-18 LAB — CBC
HCT: 28.3 % — ABNORMAL LOW (ref 36.0–46.0)
Hemoglobin: 8.1 g/dL — ABNORMAL LOW (ref 12.0–15.0)
MCH: 21 pg — ABNORMAL LOW (ref 26.0–34.0)
MCHC: 28.6 g/dL — ABNORMAL LOW (ref 30.0–36.0)
MCV: 73.3 fL — ABNORMAL LOW (ref 80.0–100.0)
Platelets: 153 10*3/uL (ref 150–400)
RBC: 3.86 MIL/uL — ABNORMAL LOW (ref 3.87–5.11)
RDW: 19.6 % — ABNORMAL HIGH (ref 11.5–15.5)
WBC: 4.9 10*3/uL (ref 4.0–10.5)
nRBC: 0 % (ref 0.0–0.2)

## 2019-09-18 LAB — GLUCOSE, CAPILLARY
Glucose-Capillary: 108 mg/dL — ABNORMAL HIGH (ref 70–99)
Glucose-Capillary: 93 mg/dL (ref 70–99)
Glucose-Capillary: 99 mg/dL (ref 70–99)
Glucose-Capillary: 113 mg/dL — ABNORMAL HIGH (ref 70–99)

## 2019-09-18 LAB — HIV ANTIBODY (ROUTINE TESTING W REFLEX): HIV Screen 4th Generation wRfx: NONREACTIVE

## 2019-09-18 LAB — ABO/RH: ABO/RH(D): O POS

## 2019-09-18 MED ORDER — AMLODIPINE BESYLATE 10 MG PO TABS
10.0000 mg | ORAL_TABLET | Freq: Every day | ORAL | Status: DC
  Administered 2019-09-18 – 2019-09-20 (×3): 10 mg via ORAL
  Filled 2019-09-18 (×3): qty 1

## 2019-09-18 MED ORDER — FERROUS SULFATE 325 (65 FE) MG PO TABS
325.0000 mg | ORAL_TABLET | Freq: Two times a day (BID) | ORAL | Status: DC
  Filled 2019-09-18: qty 1

## 2019-09-18 MED ORDER — POTASSIUM CHLORIDE CRYS ER 20 MEQ PO TBCR
40.0000 meq | EXTENDED_RELEASE_TABLET | Freq: Once | ORAL | Status: AC
  Administered 2019-09-18: 40 meq via ORAL
  Filled 2019-09-18: qty 2

## 2019-09-18 MED ORDER — POLYETHYLENE GLYCOL 3350 17 G PO PACK: 17.0000 g | PACK | Freq: Every day | ORAL | Status: DC | PRN

## 2019-09-18 MED ORDER — IPRATROPIUM-ALBUTEROL 0.5-2.5 (3) MG/3ML IN SOLN: 3.0000 mL | RESPIRATORY_TRACT | Status: DC | PRN

## 2019-09-18 MED ORDER — SENNOSIDES-DOCUSATE SODIUM 8.6-50 MG PO TABS
2.0000 | ORAL_TABLET | Freq: Two times a day (BID) | ORAL | Status: DC
  Administered 2019-09-18 – 2019-09-20 (×3): 2 via ORAL
  Filled 2019-09-18 (×3): qty 2

## 2019-09-18 MED ORDER — SODIUM CHLORIDE 0.9 % IV SOLN
510.0000 mg | Freq: Once | INTRAVENOUS | Status: AC
  Administered 2019-09-18: 510 mg via INTRAVENOUS
  Filled 2019-09-18: qty 510

## 2019-09-18 MED ORDER — LISINOPRIL 5 MG PO TABS
5.0000 mg | ORAL_TABLET | Freq: Every day | ORAL | Status: DC
  Administered 2019-09-18 – 2019-09-20 (×3): 5 mg via ORAL
  Filled 2019-09-18 (×3): qty 1

## 2019-09-18 NOTE — Progress Notes (Signed)
PROGRESS NOTE    Dimitri Dsouza  CWC:[id number] DOB: 1975-04-16 DOA: 09/17/2019 PCP: Patient, No Pcp Per   Brief Narrative:  31 with history of HTN, DM 2, COPD admitted for abdominal pain.  [facility] ultrasound showed cholelithiasis, hemoglobin 6.7 transferred to [facility].   Assessment & Plan:   Principal Problem:   Symptomatic anemia Active Problems:   Upper abdominal pain   COPD (chronic obstructive pulmonary disease) (HCC)   Essential hypertension   History of tobacco abuse   Left upper lobe pulmonary nodule   Type 2 diabetes mellitus, without long-term current use of insulin (HCC)  Abdominal pain, nonspecific, secondary to cholelithiasis Symptomatic anemia, unknown exact etiology, microcytic/iron deficiency -Admission hemoglobin 6.7, posttransfusion 8.1 status post 1 unit PRBC.  Will give patient IV iron thereafter p.o. supplements with bowel regimen -Right upper quadrant showing cholelithiasis but no evidence of infection.  We will continue to monitor for now. -TSH within normal limits, B12 normal, folate low normal -Folic acid supplements -Abdominal x-ray, if necessary will obtain CT of the abdomen and pelvis  Thrombocytopenia -Unknown exact etiology.  No evidence of bleeding  Diabetes mellitus type 2 -A1c 6.7 -Levemir 5 units bedtime.  NovoLog 4 units Premeal.  Insulin sliding scale  Essential hypertension -Supposed to be on Norvasc 10 mg daily and lisinopril 5 mg daily in care everywhere  History of COPD -Bronchodilators.  Care everywhere records from 12/11/2018 reviewed   DVT prophylaxis: None due to anemia and thrombocytopenia Code Status: Full code Family Communication: None at bedside  Status is: Inpatient  Remains inpatient appropriate because:IV treatments appropriate due to intensity of illness or inability to take PO   Dispo: The patient is from: Home              Anticipated d/c is to: Home              Anticipated d/c  date is: 1 day              Patient currently is not medically stable to d/c.  Still having difficulty tolerating oral diet     Subjective: Patient tells me she feels bloated and having trouble tolerating any oral diet.  She is trying to bargain and told me that she wants some food from outside but I explained her she needs to comply with hospital diet and avoid any fatty greasy heavy meal as she is improving.  She also denies any heavy menstruation at home, typically last for 3-5 days and goes through about 5 pads a day.  No evidence of blood in her stool, she has never had colonoscopy.  Review of Systems Otherwise negative except as per HPI, including: General: Denies fever, chills, night sweats or unintended weight loss. Resp: Denies cough, wheezing, shortness of breath. Cardiac: Denies chest pain, palpitations, orthopnea, paroxysmal nocturnal dyspnea. GI: Denies abdominal pain, nausea, vomiting, diarrhea or constipation GU: Denies dysuria, frequency, hesitancy or incontinence MS: Denies muscle aches, joint pain or swelling Neuro: Denies headache, neurologic deficits (focal weakness, numbness, tingling), abnormal gait Psych: Denies anxiety, depression, SI/HI/AVH Skin: Denies new rashes or lesions ID: Denies sick contacts, exotic exposures, travel  Examination:  General exam: Appears calm and comfortable  Respiratory system: Clear to auscultation. Respiratory effort normal. Cardiovascular system: S1 & S2 heard, RRR. No JVD, murmurs, rubs, gallops or clicks. No pedal edema. Gastrointestinal system: Nontender but slightly distended Central nervous system: Alert and oriented. No focal neurological deficits. Extremities: Symmetric 5 x 5 power. Skin:  No rashes, lesions or ulcers Psychiatry: Judgement and insight appear normal. Mood & affect appropriate.     Objective: Vitals:   09/17/19 2035 09/18/19 0033 09/18/19 0102 09/18/19 0419  BP: (!) 155/85 (!) 155/93 137/83 (!) 154/90    Pulse: 80 90 84 79  Resp: 16 18    Temp: 98.7 F (37.1 C) 98.1 F (36.7 C) 98.3 F (36.8 C) 98.3 F (36.8 C)  TempSrc: Oral Oral Oral Oral  SpO2: 96% 92% 90%   Weight: 89.9 kg     Height: 5\' 2"  (1.575 m)       Intake/Output Summary (Last 24 hours) at 09/18/2019 0753 Last data filed at 09/18/2019 0400 Gross per 24 hour  Intake 390 ml  Output --  Net 390 ml   Filed Weights   09/17/19 1345 09/17/19 2035  Weight: 90.7 kg 89.9 kg     Data Reviewed:   CBC: Recent Labs  Lab 09/17/19 1355 09/18/19 0607  WBC 5.1 4.9  HGB 6.7* 8.1*  HCT 24.7* 28.3*  MCV 71.2* 73.3*  PLT 148* 408   Basic Metabolic Panel: Recent Labs  Lab 09/17/19 1355  NA 139  K 3.6  CL 106  CO2 25  GLUCOSE 206*  BUN 7  CREATININE 0.86  CALCIUM 8.8*   GFR: Estimated Creatinine Clearance: 87 mL/min (by C-G formula based on SCr of 0.86 mg/dL). Liver Function Tests: Recent Labs  Lab 09/17/19 1355  AST 26  ALT 26  ALKPHOS 66  BILITOT 0.4  PROT 6.6  ALBUMIN 3.5   Recent Labs  Lab 09/17/19 1355  LIPASE 49   No results for input(s): AMMONIA in the last 168 hours. Coagulation Profile: No results for input(s): INR, PROTIME in the last 168 hours. Cardiac Enzymes: No results for input(s): CKTOTAL, CKMB, CKMBINDEX, TROPONINI in the last 168 hours. BNP (last 3 results) No results for input(s): PROBNP in the last 8760 hours. HbA1C: Recent Labs    09/17/19 2157  HGBA1C 6.7*   CBG: Recent Labs  Lab 09/17/19 2219 09/18/19 0715  GLUCAP 139* 108*   Lipid Profile: No results for input(s): CHOL, HDL, LDLCALC, TRIG, CHOLHDL, LDLDIRECT in the last 72 hours. Thyroid Function Tests: Recent Labs    09/17/19 2157  TSH 0.741   Anemia Panel: Recent Labs    09/17/19 2157  VITAMINB12 355  FOLATE 7.6  FERRITIN 3*  TIBC 534*  IRON 19*  RETICCTPCT 1.7   Sepsis Labs: No results for input(s): PROCALCITON, LATICACIDVEN in the last 168 hours.  Recent Results (from the past 240 hour(s))   SARS Coronavirus 2 by RT PCR (hospital order, performed in [facility] hospital lab) Nasopharyngeal Nasopharyngeal Swab     Status: None   Collection Time: 09/17/19  4:12 PM   Specimen: Nasopharyngeal Swab  Result Value Ref Range Status   SARS Coronavirus 2 NEGATIVE NEGATIVE Final    Comment: (NOTE) SARS-CoV-2 target nucleic acids are NOT DETECTED.  The SARS-CoV-2 RNA is generally detectable in upper and lower respiratory specimens during the acute phase of infection. The lowest concentration of SARS-CoV-2 viral copies this assay can detect is 250 copies / mL. A negative result does not preclude SARS-CoV-2 infection and should not be used as the sole basis for treatment or other patient management decisions.  A negative result may occur with improper specimen collection / handling, submission of specimen other than nasopharyngeal swab, presence of viral mutation(s) within the areas targeted by this assay, and inadequate number of viral copies (<250 copies /  mL). A negative result must be combined with clinical observations, patient history, and epidemiological information.  Fact Sheet for Patients:   BoilerBrush.com.cy  Fact Sheet for Healthcare Providers: https://pope.com/  This test is not yet approved or  cleared by the [place] FDA and has been authorized for detection and/or diagnosis of SARS-CoV-2 by FDA under an Emergency Use Authorization (EUA).  This EUA will remain in effect (meaning this test can be used) for the duration of the COVID-19 declaration under Section 564(b)(1) of the Act, 21 U.S.C. section 360bbb-3(b)(1), unless the authorization is terminated or revoked sooner.  Performed at [facility], [address], [place], [place] [address]          Radiology Studies: X-ray chest PA and lateral  Result Date: 09/17/2019 CLINICAL DATA:  Pleural effusion.  Epigastric pain. EXAM: CHEST - 2 VIEW  COMPARISON:  Abdominal ultrasound earlier today. Chest radiograph yesterday at [facility]. FINDINGS: No pleural effusion is visualized radiographically. No blunting of costophrenic angle. Upper normal heart size. Mild peribronchial thickening. No confluent airspace disease. No pneumothorax. No acute osseous abnormalities are seen. IMPRESSION: 1. No pleural effusion visualized radiographically. 2. Mild peribronchial thickening, can be seen with asthma or bronchitis. 3. Borderline cardiomegaly. Electronically Signed   By: Narda Rutherford M.D.   On: 09/17/2019 21:57   US Abdomen Complete  Result Date: 09/17/2019 CLINICAL DATA:  Epigastric region pain EXAM: ABDOMEN ULTRASOUND COMPLETE COMPARISON:  None. FINDINGS: Gallbladder: There are mobile echogenic foci in the gallbladder which shadow weakly consistent with cholelithiasis. Largest gallstone measures 5 mm in length. There is no appreciable gallbladder wall thickening or pericholecystic fluid. No sonographic Murphy sign noted by sonographer. Common bile duct: Diameter: 2 mm. No intrahepatic, common hepatic, or common bile duct dilatation. Liver: No focal lesion identified. Within normal limits in parenchymal echogenicity. Portal vein is patent on color Doppler imaging with normal direction of blood flow towards the liver. IVC: No abnormality visualized. Pancreas: No pancreatic mass or inflammatory focus. Spleen: Size and appearance within normal limits. Right Kidney: Length: 10.1 cm. Echogenicity within normal limits. No mass or hydronephrosis visualized. Left Kidney: Length: 10.6 cm. Echogenicity within normal limits. No mass or hydronephrosis visualized. Abdominal aorta: No aneurysm visualized. Other findings: No ascites evident. Apparent small right pleural effusion. IMPRESSION: 1. Cholelithiasis. No gallbladder wall thickening or pericholecystic fluid. 2.  Apparent small right pleural effusion. 3.  Study otherwise unremarkable. Electronically Signed   By:  Bretta Bang III M.D.   On: 09/17/2019 17:22        Scheduled Meds: . folic acid  1 mg Oral Daily  . insulin aspart  0-20 Units Subcutaneous TID WC  . insulin aspart  0-5 Units Subcutaneous QHS  . insulin aspart  4 Units Subcutaneous TID WC  . insulin detemir  5 Units Subcutaneous QHS  . multivitamin with minerals  1 tablet Oral Daily  . nicotine  14 mg Transdermal Daily  . sodium chloride flush  3 mL Intravenous Q12H  . thiamine  100 mg Oral Daily   Continuous Infusions: . sodium chloride       LOS: 1 day   Time spent= 35 mins    Tashianna Broome Joline Maxcy, MD [facility]  If 7PM-7AM, please contact night-coverage  09/18/2019, 7:53 AM

## 2019-09-19 ENCOUNTER — Inpatient Hospital Stay (HOSPITAL_COMMUNITY): Payer: Self-pay

## 2019-09-19 LAB — CBC
HCT: 28.9 % — ABNORMAL LOW (ref 36.0–46.0)
Hemoglobin: 8.2 g/dL — ABNORMAL LOW (ref 12.0–15.0)
MCH: 20.7 pg — ABNORMAL LOW (ref 26.0–34.0)
MCHC: 28.4 g/dL — ABNORMAL LOW (ref 30.0–36.0)
MCV: 72.8 fL — ABNORMAL LOW (ref 80.0–100.0)
Platelets: 146 10*3/uL — ABNORMAL LOW (ref 150–400)
RBC: 3.97 MIL/uL (ref 3.87–5.11)
RDW: 19.5 % — ABNORMAL HIGH (ref 11.5–15.5)
WBC: 5.7 10*3/uL (ref 4.0–10.5)
nRBC: 0 % (ref 0.0–0.2)

## 2019-09-19 LAB — COMPREHENSIVE METABOLIC PANEL
ALT: 21 U/L (ref 0–44)
AST: 18 U/L (ref 15–41)
Albumin: 3.6 g/dL (ref 3.5–5.0)
Alkaline Phosphatase: 66 U/L (ref 38–126)
Anion gap: 9 (ref 5–15)
BUN: 11 mg/dL (ref 6–20)
CO2: 26 mmol/L (ref 22–32)
Calcium: 9.5 mg/dL (ref 8.9–10.3)
Chloride: 104 mmol/L (ref 98–111)
Creatinine, Ser: 0.88 mg/dL (ref 0.44–1.00)
GFR calc Af Amer: >60 mL/min (ref >60)
GFR calc non Af Amer: >60 mL/min (ref >60)
Glucose, Bld: 113 mg/dL — ABNORMAL HIGH (ref 70–99)
Potassium: 3.8 mmol/L (ref 3.5–5.1)
Sodium: 139 mmol/L (ref 135–145)
Total Bilirubin: 0.5 mg/dL (ref 0.3–1.2)
Total Protein: 6.7 g/dL (ref 6.5–8.1)

## 2019-09-19 LAB — GLUCOSE, CAPILLARY
Glucose-Capillary: 106 mg/dL — ABNORMAL HIGH (ref 70–99)
Glucose-Capillary: 157 mg/dL — ABNORMAL HIGH (ref 70–99)
Glucose-Capillary: 87 mg/dL (ref 70–99)
Glucose-Capillary: 116 mg/dL — ABNORMAL HIGH (ref 70–99)

## 2019-09-19 LAB — TYPE AND SCREEN
ABO/RH(D): O POS
Antibody Screen: NEGATIVE
Unit division: 0

## 2019-09-19 LAB — BPAM RBC
Blood Product Expiration Date: 202107142359
ISSUE DATE / TIME: 202106120041
Unit Type and Rh: 5100

## 2019-09-19 LAB — MAGNESIUM: Magnesium: 2.2 mg/dL (ref 1.7–2.4)

## 2019-09-19 MED ORDER — IOHEXOL 9 MG/ML PO SOLN
ORAL | Status: AC
  Administered 2019-09-19: 500 mL via ORAL
  Filled 2019-09-19: qty 1000

## 2019-09-19 MED ORDER — SODIUM CHLORIDE (PF) 0.9 % IJ SOLN
INTRAMUSCULAR | Status: AC
  Administered 2019-09-19: 3 mL via INTRAVENOUS
  Filled 2019-09-19: qty 50

## 2019-09-19 MED ORDER — IOHEXOL 9 MG/ML PO SOLN
500.0000 mL | ORAL | Status: AC
  Administered 2019-09-19: 500 mL via ORAL

## 2019-09-19 MED ORDER — PANTOPRAZOLE SODIUM 40 MG PO TBEC
40.0000 mg | DELAYED_RELEASE_TABLET | Freq: Every day | ORAL | Status: DC
  Administered 2019-09-19 – 2019-09-20 (×2): 40 mg via ORAL
  Filled 2019-09-19 (×2): qty 1

## 2019-09-19 MED ORDER — IOHEXOL 300 MG/ML  SOLN
100.0000 mL | Freq: Once | INTRAMUSCULAR | Status: AC | PRN
  Administered 2019-09-19: 100 mL via INTRAVENOUS

## 2019-09-19 NOTE — Progress Notes (Signed)
Patient is NPO for an xray. Her CBG is 157. Dr. Garald Braver notified of the CBG and ordered that her insulin be held because she's NPO

## 2019-09-19 NOTE — Progress Notes (Signed)
PROGRESS NOTE    Grace Collier  TDV:[id number] DOB: 1976/03/19 DOA: 09/17/2019 PCP: Patient, No Pcp Per   Brief Narrative:  22 with history of HTN, DM 2, COPD admitted for abdominal pain.  [facility] ultrasound showed cholelithiasis, hemoglobin 6.7 transferred to [facility]. CT abd/pelvis with calcified fibroid, ill-defined spleen mass. MRI abd ordered as recommended per Radiology. No history of menorrhagia or GI bleed. Patient has not had colonoscopy. Hgb stable post-transfusion with 1 unit of PRBC but she remains tired.    Assessment & Plan:   Principal Problem:   Symptomatic anemia Active Problems:   Upper abdominal pain   COPD (chronic obstructive pulmonary disease) (HCC)   Essential hypertension   History of tobacco abuse   Left upper lobe pulmonary nodule   Type 2 diabetes mellitus, without long-term current use of insulin (HCC)  Abdominal pain, nonspecific, secondary to cholelithiasis Symptomatic anemia, unknown exact etiology, microcytic/iron deficiency -Admission hemoglobin 6.7, posttransfusion 8.1 status post 1 unit PRBC.  Received IV iron thereafter p.o. supplements with bowel regimen -Right upper quadrant showing cholelithiasis but no evidence of infection.  We will continue to monitor for now. -TSH within normal limits, B12 normal, folate low normal -Folic acid supplements ordered -Abdominal x-ray with pelvis mass. CT abd/pelvis with spleen mass--MRI abd ordered.   Thrombocytopenia -Unknown exact etiology.  No evidence of bleeding  Diabetes mellitus type 2 -A1c 6.7 -Levemir 5 units bedtime.  NovoLog 4 units Premeal.  Insulin sliding scale  Essential hypertension -Supposed to be on Norvasc 10 mg daily and lisinopril 5 mg daily--remains on these medication, BP is overall stable.   History of COPD -Bronchodilators. No exacerbation.     DVT prophylaxis: SCDs, no chem VTE pro due to severe anemia Code Status: Full code Family Communication:  None at bedside  Status is: Inpatient  Remains inpatient appropriate because:IV treatments appropriate due to intensity of illness or inability to take PO   Dispo: The patient is from: Home              Anticipated d/c is to: Home              Anticipated d/c date is: 1 day              Patient currently is not medically stable to d/c.  Still having difficulty tolerating oral diet     Subjective: Patient reports feeling tired. Denies GI bleeding. Has no history of menorrhagia. Never had colonoscopy. Denies abdominal pain.   Review of Systems Fatigue, denies n/v  Examination: General exam: Appears calm and comfortable. Pallor Respiratory system: No respiratory distress, no wheezing Cardiovascular system: S1 & S2  Present, normal rate Gastrointestinal system: Nontender but slightly distended Central nervous system: Alert and oriented x3, no facial droop. Able to move UE and LE voluntarily.  Extremities: Symmetric 5 x 5 power. Skin: No rashes, lesions or ulcers Psychiatry: Appropriate mood and affect    Objective: Vitals:   09/19/19 0618 09/19/19 1140 09/19/19 1141 09/19/19 1406  BP: (!) 141/96 120/76 120/76 130/82  Pulse: 75 69  76  Resp: 16   15  Temp: 97.8 F (36.6 C)   98 F (36.7 C)  TempSrc: Oral   Oral  SpO2: 92% 96%  90%  Weight:      Height:        Intake/Output Summary (Last 24 hours) at 09/19/2019 1753 Last data filed at 09/19/2019 0941 Gross per 24 hour  Intake 778.02 ml  Output --  Net 778.02 ml   Filed Weights   09/17/19 1345 09/17/19 2035  Weight: 90.7 kg 89.9 kg     Data Reviewed:   CBC: Recent Labs  Lab 09/17/19 1355 09/18/19 0607 09/19/19 0603  WBC 5.1 4.9 5.7  HGB 6.7* 8.1* 8.2*  HCT 24.7* 28.3* 28.9*  MCV 71.2* 73.3* 72.8*  PLT 148* 153 546*   Basic Metabolic Panel: Recent Labs  Lab 09/17/19 1355 09/18/19 1011 09/19/19 0603  NA 139 140 139  K 3.6 4.0 3.8  CL 106 105 104  CO2 25 26 26   GLUCOSE 206* 132* 113*  BUN 7 10  11   CREATININE 0.86 0.82 0.88  CALCIUM 8.8* 9.3 9.5  MG  --   --  2.2   GFR: Estimated Creatinine Clearance: 85 mL/min (by C-G formula based on SCr of 0.88 mg/dL). Liver Function Tests: Recent Labs  Lab 09/17/19 1355 09/18/19 1011 09/19/19 0603  AST 26 22 18   ALT 26 25 21   ALKPHOS 66 66 66  BILITOT 0.4 0.5 0.5  PROT 6.6 7.0 6.7  ALBUMIN 3.5 3.6 3.6   Recent Labs  Lab 09/17/19 1355  LIPASE 49   No results for input(s): AMMONIA in the last 168 hours. Coagulation Profile: No results for input(s): INR, PROTIME in the last 168 hours. Cardiac Enzymes: No results for input(s): CKTOTAL, CKMB, CKMBINDEX, TROPONINI in the last 168 hours. BNP (last 3 results) No results for input(s): PROBNP in the last 8760 hours. HbA1C: Recent Labs    09/17/19 2157  HGBA1C 6.7*   CBG: Recent Labs  Lab 09/18/19 1706 09/18/19 2103 09/19/19 0703 09/19/19 1131 09/19/19 1705  GLUCAP 99 113* 106* 157* 87   Lipid Profile: No results for input(s): CHOL, HDL, LDLCALC, TRIG, CHOLHDL, LDLDIRECT in the last 72 hours. Thyroid Function Tests: Recent Labs    09/17/19 2157  TSH 0.741   Anemia Panel: Recent Labs    09/17/19 2157  VITAMINB12 355  FOLATE 7.6  FERRITIN 3*  TIBC 534*  IRON 19*  RETICCTPCT 1.7   Sepsis Labs: No results for input(s): PROCALCITON, LATICACIDVEN in the last 168 hours.  Recent Results (from the past 240 hour(s))  SARS Coronavirus 2 by RT PCR (hospital order, performed in [facility] hospital lab) Nasopharyngeal Nasopharyngeal Swab     Status: None   Collection Time: 09/17/19  4:12 PM   Specimen: Nasopharyngeal Swab  Result Value Ref Range Status   SARS Coronavirus 2 NEGATIVE NEGATIVE Final    Comment: (NOTE) SARS-CoV-2 target nucleic acids are NOT DETECTED.  The SARS-CoV-2 RNA is generally detectable in upper and lower respiratory specimens during the acute phase of infection. The lowest concentration of SARS-CoV-2 viral copies this assay can detect is  250 copies / mL. A negative result does not preclude SARS-CoV-2 infection and should not be used as the sole basis for treatment or other patient management decisions.  A negative result may occur with improper specimen collection / handling, submission of specimen other than nasopharyngeal swab, presence of viral mutation(s) within the areas targeted by this assay, and inadequate number of viral copies (<250 copies / mL). A negative result must be combined with clinical observations, patient history, and epidemiological information.  Fact Sheet for Patients:   StrictlyIdeas.no  Fact Sheet for Healthcare Providers: BankingDealers.co.za  This test is not yet approved or  cleared by the [place] FDA and has been authorized for detection and/or diagnosis of SARS-CoV-2 by FDA under an Emergency Use Authorization (EUA).  This EUA  will remain in effect (meaning this test can be used) for the duration of the COVID-19 declaration under Section 564(b)(1) of the Act, 21 U.S.C. section 360bbb-3(b)(1), unless the authorization is terminated or revoked sooner.  Performed at [facility], [address], [place], [place] [address]          Radiology Studies: X-ray chest PA and lateral  Result Date: 09/17/2019 CLINICAL DATA:  Pleural effusion.  Epigastric pain. EXAM: CHEST - 2 VIEW COMPARISON:  Abdominal ultrasound earlier today. Chest radiograph yesterday at [facility]. FINDINGS: No pleural effusion is visualized radiographically. No blunting of costophrenic angle. Upper normal heart size. Mild peribronchial thickening. No confluent airspace disease. No pneumothorax. No acute osseous abnormalities are seen. IMPRESSION: 1. No pleural effusion visualized radiographically. 2. Mild peribronchial thickening, can be seen with asthma or bronchitis. 3. Borderline cardiomegaly. Electronically Signed   By: Narda Rutherford M.D.   On:  09/17/2019 21:57   DG Abd 1 View  Result Date: 09/18/2019 CLINICAL DATA:  Abdominal pain and known cholelithiasis. EXAM: ABDOMEN - 1 VIEW COMPARISON:  09/15/2014 FINDINGS: Bowel gas pattern is nonobstructive. No free peritoneal air. Paucity of gas over the rectosigmoid colon. 3.7 cm rim calcified structure over the left pelvis possibly uterine fibroid. Remainder the exam is unchanged. IMPRESSION: Nonspecific, nonobstructive bowel gas pattern. Electronically Signed   By: Elberta Fortis M.D.   On: 09/18/2019 12:55   CT ABDOMEN PELVIS W CONTRAST  Result Date: 09/19/2019 CLINICAL DATA:  Epigastric pain, vomiting, constipation, anemia, pelvic mass, abdominal aorta measures suspected EXAM: CT ABDOMEN AND PELVIS WITH CONTRAST TECHNIQUE: Multidetector CT imaging of the abdomen and pelvis was performed using the standard protocol following bolus administration of intravenous contrast. CONTRAST:  OMNIPAQUE IOHEXOL 300 MG/ML SOLN, additional oral enteric contrast COMPARISON:  None. FINDINGS: Lower chest: No acute abnormality. Hepatobiliary: No solid liver abnormality is seen. Multiple gallstones near the gallbladder neck. Gallbladder wall thickening, or biliary dilatation. Pancreas: Unremarkable. No pancreatic ductal dilatation or surrounding inflammatory changes. Spleen: Spleen is at the upper limit of normal in size, maximum span 13.0 cm. There is an ill-defined hypodense lesion of the posterior spleen measuring 2.9 x 2.8 cm (series 2, image 27). Adrenals/Urinary Tract: Adrenal glands are unremarkable. Kidneys are normal, without renal calculi, solid lesion, or hydronephrosis. Bladder is unremarkable. Stomach/Bowel: Stomach is within normal limits. Appendix appears normal. No evidence of bowel wall thickening, distention, or inflammatory changes. Vascular/Lymphatic: No significant vascular findings are present. No enlarged abdominal or pelvic lymph nodes. Reproductive: Rim calcified fibroid of the left aspect of  the uterine body measuring 3.4 cm (series 2, image 73). Other: No abdominal wall hernia or abnormality. No abdominopelvic ascites. Musculoskeletal: No acute or significant osseous findings. IMPRESSION: 1. 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 No other evidence of pelvic mass. 2. No evidence of abdominal aortic aneurysm. 3. Cholelithiasis without evidence of acute cholecystitis. 4. There is an ill-defined hypodense lesion of the posterior spleen measuring 2.9 cm. 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 In the setting of otherwise unexplained anemia, however consider multiphasic contrast enhanced MRI to further assess. Electronically Signed   By: Lauralyn Primes M.D.   On: 09/19/2019 13:54        Scheduled Meds:  amLODipine  10 mg Oral Daily   ferrous sulfate  325 mg Oral BID WC   folic acid  1 mg Oral Daily   insulin aspart  0-20  Units Subcutaneous TID WC   insulin aspart  0-5 Units Subcutaneous QHS   insulin aspart  4 Units Subcutaneous TID WC   insulin detemir  5 Units Subcutaneous QHS   lisinopril  5 mg Oral Daily   multivitamin with minerals  1 tablet Oral Daily   nicotine  14 mg Transdermal Daily   pantoprazole  40 mg Oral Daily   senna-docusate  2 tablet Oral BID   sodium chloride (PF)       sodium chloride flush  3 mL Intravenous Q12H   thiamine  100 mg Oral Daily   Continuous Infusions:  sodium chloride 100 mL (09/18/19 1105)     LOS: 2 days   Time spent= 35 mins    Ky Barban, MD [facility]  If 7PM-7AM, please contact night-coverage  09/19/2019, 5:53 PM

## 2019-09-20 ENCOUNTER — Other Ambulatory Visit (HOSPITAL_COMMUNITY): Payer: Self-pay | Admitting: Radiology

## 2019-09-20 ENCOUNTER — Inpatient Hospital Stay (HOSPITAL_COMMUNITY): Payer: Self-pay

## 2019-09-20 LAB — CBC
HCT: 30 % — ABNORMAL LOW (ref 36.0–46.0)
Hemoglobin: 8.4 g/dL — ABNORMAL LOW (ref 12.0–15.0)
MCH: 20.5 pg — ABNORMAL LOW (ref 26.0–34.0)
MCHC: 28 g/dL — ABNORMAL LOW (ref 30.0–36.0)
MCV: 73.3 fL — ABNORMAL LOW (ref 80.0–100.0)
Platelets: 167 10*3/uL (ref 150–400)
RBC: 4.09 MIL/uL (ref 3.87–5.11)
RDW: 19.9 % — ABNORMAL HIGH (ref 11.5–15.5)
WBC: 6.1 10*3/uL (ref 4.0–10.5)
nRBC: 0 % (ref 0.0–0.2)

## 2019-09-20 LAB — COMPREHENSIVE METABOLIC PANEL
ALT: 20 U/L (ref 0–44)
AST: 18 U/L (ref 15–41)
Albumin: 3.7 g/dL (ref 3.5–5.0)
Alkaline Phosphatase: 62 U/L (ref 38–126)
Anion gap: 9 (ref 5–15)
BUN: 9 mg/dL (ref 6–20)
CO2: 27 mmol/L (ref 22–32)
Calcium: 9.2 mg/dL (ref 8.9–10.3)
Chloride: 104 mmol/L (ref 98–111)
Creatinine, Ser: 0.85 mg/dL (ref 0.44–1.00)
GFR calc Af Amer: >60 mL/min (ref >60)
GFR calc non Af Amer: >60 mL/min (ref >60)
Glucose, Bld: 101 mg/dL — ABNORMAL HIGH (ref 70–99)
Potassium: 4 mmol/L (ref 3.5–5.1)
Sodium: 140 mmol/L (ref 135–145)
Total Bilirubin: 0.4 mg/dL (ref 0.3–1.2)
Total Protein: 7 g/dL (ref 6.5–8.1)

## 2019-09-20 LAB — GLUCOSE, CAPILLARY
Glucose-Capillary: 93 mg/dL (ref 70–99)
Glucose-Capillary: 110 mg/dL — ABNORMAL HIGH (ref 70–99)
Glucose-Capillary: 169 mg/dL — ABNORMAL HIGH (ref 70–99)

## 2019-09-20 LAB — MAGNESIUM: Magnesium: 2.3 mg/dL (ref 1.7–2.4)

## 2019-09-20 MED ORDER — FERROUS SULFATE 325 (65 FE) MG PO TABS: 325.0000 mg | ORAL_TABLET | Freq: Two times a day (BID) | ORAL | 0 refills | Status: DC

## 2019-09-20 MED ORDER — PANTOPRAZOLE SODIUM 40 MG PO TBEC: 40.0000 mg | DELAYED_RELEASE_TABLET | Freq: Every day | ORAL | 0 refills | Status: DC

## 2019-09-20 MED ORDER — TRAZODONE HCL 50 MG PO TABS: 25.0000 mg | ORAL_TABLET | Freq: Every evening | ORAL | 0 refills | Status: DC | PRN

## 2019-09-20 MED ORDER — AMLODIPINE BESYLATE 10 MG PO TABS: 10.0000 mg | ORAL_TABLET | Freq: Every day | ORAL | 0 refills | Status: DC

## 2019-09-20 MED ORDER — POLYETHYLENE GLYCOL 3350 17 G PO PACK: 17.0000 g | PACK | Freq: Every day | ORAL | 0 refills | Status: DC | PRN

## 2019-09-20 MED ORDER — SENNOSIDES-DOCUSATE SODIUM 8.6-50 MG PO TABS: 2.0000 | ORAL_TABLET | Freq: Two times a day (BID) | ORAL | 0 refills | Status: DC | PRN

## 2019-09-20 MED ORDER — GADOBUTROL 1 MMOL/ML IV SOLN
8.0000 mL | Freq: Once | INTRAVENOUS | Status: AC | PRN
  Administered 2019-09-20: 8 mL via INTRAVENOUS

## 2019-09-20 MED ORDER — LISINOPRIL 5 MG PO TABS: 5.0000 mg | ORAL_TABLET | Freq: Every day | ORAL | 0 refills | Status: DC

## 2019-09-20 NOTE — Discharge Summary (Signed)
Physician Discharge Summary  Grace Collier OZH:[id number] DOB: 27-Jun-1975 DOA: 09/17/2019  PCP: Patient, No Pcp Per  Admit date: 09/17/2019 Discharge date: 09/20/2019  Admitted From: Home Disposition: Home  Recommendations for Outpatient Follow-up:  1. Follow up with PCP in 1-2 weeks 2. Please obtain BMP/CBC in one week your next doctors visit.  3. Upon discharge she was given prescription for Norvasc 10 mg daily, lisinopril 5 mg daily 4. Ferrous sulfate twice daily with aggressive bowel regimen 5. Trazodone 50 mg daily for insomnia at bedtime as needed 6. PPI daily before breakfast 7. Repeat outpatient MRI abdomen in 3 months to reevaluate splenic lesion   Discharge Condition: Stable CODE STATUS: Full code Diet recommendation: Heart healthy diabetic  Brief/Interim Summary: 43 with history of HTN, DM 2, COPD admitted for abdominal pain.  [facility] ultrasound showed cholelithiasis, hemoglobin 6.7 transferred to [facility].  Upon admission she was noted to be iron deficient therefore received IV iron followed by p.o. with bowel regimen.  Right upper quadrant ultrasound showed cholelithiasis without any evidence of infection.  There was concerns of splenic mass therefore underwent MRI which was negative for any acute pathology recommended outpatient follow-up repeat MRI in about 3 months.  She was tolerating oral without any issues.  Advised to follow-up outpatient with her primary care provider.   Assessment & Plan:   Principal Problem:   Symptomatic anemia Active Problems:   Upper abdominal pain   COPD (chronic obstructive pulmonary disease) (HCC)   Essential hypertension   History of tobacco abuse   Left upper lobe pulmonary nodule   Type 2 diabetes mellitus, without long-term current use of insulin (HCC)  Abdominal pain, nonspecific, secondary to cholelithiasis Symptomatic anemia, unknown exact etiology, microcytic/iron deficiency -Admission hemoglobin  6.7, posttransfusion 8.1 status post 1 unit PRBC.  Status post 1 dose IV iron, now on p.o. supplements with bowel regimen. -Right upper quadrant showing cholelithiasis but no evidence of infection.  We will continue to monitor for now. 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  Recommended outpatient follow-up with PCP and repeating MRI abdomen in 3 months.  Thrombocytopenia -Unknown exact etiology.  No evidence of bleeding  Diabetes mellitus type 2 -A1c 6.7 -Levemir 5 units bedtime.  NovoLog 4 units Premeal.  Insulin sliding scale  Essential hypertension -Supposed to be on Norvasc 10 mg daily and lisinopril 5 mg daily in care everywhere.  Follow-up outpatient PCP  History of COPD -Bronchodilators.  Not in exacerbation   Discharge Diagnoses:  Principal Problem:   Symptomatic anemia Active Problems:   Upper abdominal pain   COPD (chronic obstructive pulmonary disease) (HCC)   Essential hypertension   History of tobacco abuse   Left upper lobe pulmonary nodule   Type 2 diabetes mellitus, without long-term current use of insulin (HCC)    Consultations:  None  Subjective: Feels okay no complaints  Discharge Exam: Vitals:   09/20/19 0541 09/20/19 1410  BP: (!) 143/86 116/81  Pulse: 78 86  Resp: 14 15  Temp: 97.9 F (36.6 C) 98.2 F (36.8 C)  SpO2: 95% 95%   Vitals:   09/19/19 1406 09/19/19 1948 09/20/19 0541 09/20/19 1410  BP: 130/82 (!) 150/78 (!) 143/86 116/81  Pulse: 76 78 78 86  Resp: 15 16 14 15   Temp: 98 F (36.7 C) 98.3 F (36.8 C) 97.9 F (36.6 C) 98.2 F (36.8 C)  TempSrc: Oral Oral Oral  Oral  SpO2: 90% 93% 95% 95%  Weight:      Height:        General: Pt is alert, awake, not in acute distress Cardiovascular: RRR, S1/S2 +, no rubs, no gallops Respiratory: CTA bilaterally, no wheezing, no rhonchi Abdominal: Soft, NT, ND, bowel sounds  + Extremities: no edema, no cyanosis  Discharge Instructions   Allergies as of 09/20/2019   No Known Allergies     Medication List    STOP taking these medications   BC HEADACHE PO     TAKE these medications   amLODipine 10 MG tablet Commonly known as: NORVASC Take 1 tablet (10 mg total) by mouth daily. Start taking on: September 21, 2019   ferrous sulfate 325 (65 FE) MG tablet Take 1 tablet (325 mg total) by mouth 2 (two) times daily with a meal.   lisinopril 5 MG tablet Commonly known as: ZESTRIL Take 1 tablet (5 mg total) by mouth daily. Start taking on: September 21, 2019   pantoprazole 40 MG tablet Commonly known as: PROTONIX Take 1 tablet (40 mg total) by mouth daily before breakfast.   polyethylene glycol 17 g packet Commonly known as: MIRALAX / GLYCOLAX Take 17 g by mouth daily as needed for moderate constipation or severe constipation.   senna-docusate 8.6-50 MG tablet Commonly known as: Senokot-S Take 2 tablets by mouth 2 (two) times daily as needed for mild constipation.   traZODone 50 MG tablet Commonly known as: DESYREL Take 0.5 tablets (25 mg total) by mouth at bedtime as needed for sleep.       Follow-up Information    [facility]. Schedule an appointment as soon as possible for a visit in 1 week(s).   Contact information: [address] [place] [address] [phone]             No Known Allergies  You were cared for by a hospitalist during your hospital stay. If you have any questions about your discharge medications or the care you received while you were in the hospital after you are discharged, you can call the unit and asked to speak with the hospitalist on call if the hospitalist that took care of you is not available. Once you are discharged, your primary care physician will handle any further medical issues. Please note that no refills for any discharge medications will be authorized once  you are discharged, as it is imperative that you return to your primary care physician (or establish a relationship with a primary care physician if you do not have one) for your aftercare needs so that they can reassess your need for medications and monitor your lab values.   Procedures/Studies: X-ray chest PA and lateral  Result Date: 09/17/2019 CLINICAL DATA:  Pleural effusion.  Epigastric pain. EXAM: CHEST - 2 VIEW COMPARISON:  Abdominal ultrasound earlier today. Chest radiograph yesterday at [facility]. FINDINGS: No pleural effusion is visualized radiographically. No blunting of costophrenic angle. Upper normal heart size. Mild peribronchial thickening. No confluent airspace disease. No pneumothorax. No acute osseous abnormalities are seen. IMPRESSION: 1. No pleural effusion visualized radiographically. 2. Mild peribronchial thickening, can be seen with asthma or bronchitis. 3. Borderline cardiomegaly. Electronically Signed   By: Narda Rutherford M.D.   On: 09/17/2019 21:57   DG Abd 1 View  Result Date: 09/18/2019 CLINICAL DATA:  Abdominal pain and known cholelithiasis. EXAM: ABDOMEN - 1 VIEW COMPARISON:  09/15/2014 FINDINGS: Bowel gas pattern is nonobstructive. No free  peritoneal air. Paucity of gas over the rectosigmoid colon. 3.7 cm rim calcified structure over the left pelvis possibly uterine fibroid. Remainder the exam is unchanged. IMPRESSION: Nonspecific, nonobstructive bowel gas pattern. Electronically Signed   By: Elberta Fortis M.D.   On: 09/18/2019 12:55   MR ABDOMEN W WO CONTRAST  Result Date: 09/20/2019 CLINICAL DATA:  Abdominal mass. EXAM: MRI ABDOMEN WITHOUT AND WITH CONTRAST TECHNIQUE: Multiplanar multisequence MR imaging of the abdomen was performed both before and after the administration of intravenous contrast. CONTRAST:  53mL GADAVIST GADOBUTROL 1 MMOL/ML IV SOLN COMPARISON:  CT 09/19/2019 FINDINGS: Lower chest: Lung bases are unremarkable on CT. Hepatobiliary: Marked hepatic  iron deposition in this patient who received Feraheme on 09/18/2019. No focal lesion though assessment is markedly limited by profound iron deposition. Cholelithiasis without biliary ductal dilation. Pancreas: Grossly normal appearance of the pancreas. No peripancreatic inflammation. Spleen:  Profound splenic iron deposition no focal splenic lesion. Adrenals/Urinary Tract: Adrenal glands are normal. Renal contours are smooth. No focal renal lesion. Stomach/Bowel: On limited assessment gastrointestinal tract is unremarkable. Vascular/Lymphatic: Vascular structures in the abdomen are patent. No adenopathy in the retroperitoneum or in the upper abdomen. Other:  No ascites. Musculoskeletal: Marked iron deposition in the marrow spaces. IMPRESSION: 1. Marked hepatic and splenic iron deposition in this patient who received Feraheme on 09/18/2019. This markedly limits assessment. No gross space-occupying lesion is seen in the spleen but due to the profound susceptibility artifact underlying lesion is not entirely excluded. Would suggest follow-up in 3 months if MRI is utilized. In the interim correllative imaging with ultrasound could be performed to ensure there is no gross lesion. If follow-up is desired before 3 month interval CT could be performed to assess for stability. 2. Lobular hepatic contours and mild splenic enlargement could be due to underlying liver disease, correlate clinically. 3. Cholelithiasis without biliary ductal dilation. Electronically Signed   By: Donzetta Kohut M.D.   On: 09/20/2019 15:24   US Abdomen Complete  Result Date: 09/17/2019 CLINICAL DATA:  Epigastric region pain EXAM: ABDOMEN ULTRASOUND COMPLETE COMPARISON:  None. FINDINGS: Gallbladder: There are mobile echogenic foci in the gallbladder which shadow weakly consistent with cholelithiasis. Largest gallstone measures 5 mm in length. There is no appreciable gallbladder wall thickening or pericholecystic fluid. No sonographic Murphy sign  noted by sonographer. Common bile duct: Diameter: 2 mm. No intrahepatic, common hepatic, or common bile duct dilatation. Liver: No focal lesion identified. Within normal limits in parenchymal echogenicity. Portal vein is patent on color Doppler imaging with normal direction of blood flow towards the liver. IVC: No abnormality visualized. Pancreas: No pancreatic mass or inflammatory focus. Spleen: Size and appearance within normal limits. Right Kidney: Length: 10.1 cm. Echogenicity within normal limits. No mass or hydronephrosis visualized. Left Kidney: Length: 10.6 cm. Echogenicity within normal limits. No mass or hydronephrosis visualized. Abdominal aorta: No aneurysm visualized. Other findings: No ascites evident. Apparent small right pleural effusion. IMPRESSION: 1. Cholelithiasis. No gallbladder wall thickening or pericholecystic fluid. 2.  Apparent small right pleural effusion. 3.  Study otherwise unremarkable. Electronically Signed   By: Bretta Bang III M.D.   On: 09/17/2019 17:22   CT ABDOMEN PELVIS W CONTRAST  Result Date: 09/19/2019 CLINICAL DATA:  Epigastric pain, vomiting, constipation, anemia, pelvic mass, abdominal aorta measures suspected EXAM: CT ABDOMEN AND PELVIS WITH CONTRAST TECHNIQUE: Multidetector CT imaging of the abdomen and pelvis was performed using the standard protocol following bolus administration of intravenous contrast. CONTRAST:  OMNIPAQUE IOHEXOL 300 MG/ML SOLN,  additional oral enteric contrast COMPARISON:  None. FINDINGS: Lower chest: No acute abnormality. Hepatobiliary: No solid liver abnormality is seen. Multiple gallstones near the gallbladder neck. Gallbladder wall thickening, or biliary dilatation. Pancreas: Unremarkable. No pancreatic ductal dilatation or surrounding inflammatory changes. Spleen: Spleen is at the upper limit of normal in size, maximum span 13.0 cm. There is an ill-defined hypodense lesion of the posterior spleen measuring 2.9 x 2.8 cm (series  2, image 27). Adrenals/Urinary Tract: Adrenal glands are unremarkable. Kidneys are normal, without renal calculi, solid lesion, or hydronephrosis. Bladder is unremarkable. Stomach/Bowel: Stomach is within normal limits. Appendix appears normal. No evidence of bowel wall thickening, distention, or inflammatory changes. Vascular/Lymphatic: No significant vascular findings are present. No enlarged abdominal or pelvic lymph nodes. Reproductive: Rim calcified fibroid of the left aspect of the uterine body measuring 3.4 cm (series 2, image 73). Other: No abdominal wall hernia or abnormality. No abdominopelvic ascites. Musculoskeletal: No acute or significant osseous findings. IMPRESSION: 1. 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 No other evidence of pelvic mass. 2. No evidence of abdominal aortic aneurysm. 3. Cholelithiasis without evidence of acute cholecystitis. 4. There is an ill-defined hypodense lesion of the posterior spleen measuring 2.9 cm. 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 In the setting of otherwise unexplained anemia, however consider multiphasic contrast enhanced MRI to further assess. Electronically Signed   By: Eddie Candle M.D.   On: 09/19/2019 13:54      The results of significant diagnostics from this hospitalization (including imaging, microbiology, ancillary and laboratory) are listed below for reference.     Microbiology: Recent Results (from the past 240 hour(s))  SARS Coronavirus 2 by RT PCR (hospital order, performed in [facility] hospital lab) Nasopharyngeal Nasopharyngeal Swab     Status: None   Collection Time: 09/17/19  4:12 PM   Specimen: Nasopharyngeal Swab  Result Value Ref Range Status   SARS Coronavirus 2 NEGATIVE NEGATIVE Final    Comment: (NOTE) SARS-CoV-2 target nucleic acids are NOT DETECTED.  The SARS-CoV-2 RNA is generally detectable in upper and lower respiratory specimens during the  acute phase of infection. The lowest concentration of SARS-CoV-2 viral copies this assay can detect is 250 copies / mL. A negative result does not preclude SARS-CoV-2 infection and should not be used as the sole basis for treatment or other patient management decisions.  A negative result may occur with improper specimen collection / handling, submission of specimen other than nasopharyngeal swab, presence of viral mutation(s) within the areas targeted by this assay, and inadequate number of viral copies (<250 copies / mL). A negative result must be combined with clinical observations, patient history, and epidemiological information.  Fact Sheet for Patients:   StrictlyIdeas.no  Fact Sheet for Healthcare Providers: BankingDealers.co.za  This test is not yet approved or  cleared by the [place] FDA and has been authorized for detection and/or diagnosis of SARS-CoV-2 by FDA under an Emergency Use Authorization (EUA).  This EUA will remain in effect (meaning this test can be used) for the duration of the COVID-19 declaration under Section 564(b)(1) of the Act, 21 U.S.C. section 360bbb-3(b)(1), unless the authorization is terminated or revoked sooner.  Performed at [facility], [address] [place], [place] [address]      Labs: BNP (last 3 results) No results for input(s): BNP in the last 8760 hours. Basic Metabolic Panel: Recent Labs  Lab 09/17/19 1355 09/18/19  1011 09/19/19 0603 09/20/19 0529  NA 139 140 139 140  K 3.6 4.0 3.8 4.0  CL 106 105 104 104  CO2 25 26 26 27   GLUCOSE 206* 132* 113* 101*  BUN 7 10 11 9   CREATININE 0.86 0.82 0.88 0.85  CALCIUM 8.8* 9.3 9.5 9.2  MG  --   --  2.2 2.3   Liver Function Tests: Recent Labs  Lab 09/17/19 1355 09/18/19 1011 09/19/19 0603 09/20/19 0529  AST 26 22 18 18   ALT 26 25 21 20   ALKPHOS 66 66 66 62  BILITOT 0.4 0.5 0.5 0.4  PROT 6.6 7.0 6.7 7.0   ALBUMIN 3.5 3.6 3.6 3.7   Recent Labs  Lab 09/17/19 1355  LIPASE 49   No results for input(s): AMMONIA in the last 168 hours. CBC: Recent Labs  Lab 09/17/19 1355 09/18/19 0607 09/19/19 0603 09/20/19 0529  WBC 5.1 4.9 5.7 6.1  HGB 6.7* 8.1* 8.2* 8.4*  HCT 24.7* 28.3* 28.9* 30.0*  MCV 71.2* 73.3* 72.8* 73.3*  PLT 148* 153 146* 167   Cardiac Enzymes: No results for input(s): CKTOTAL, CKMB, CKMBINDEX, TROPONINI in the last 168 hours. BNP: Invalid input(s): POCBNP CBG: Recent Labs  Lab 09/19/19 1131 09/19/19 1705 09/19/19 2043 09/20/19 0757 09/20/19 1213  GLUCAP 157* 87 116* 110* 169*   D-Dimer No results for input(s): DDIMER in the last 72 hours. Hgb A1c Recent Labs    09/17/19 2157  HGBA1C 6.7*   Lipid Profile No results for input(s): CHOL, HDL, LDLCALC, TRIG, CHOLHDL, LDLDIRECT in the last 72 hours. Thyroid function studies Recent Labs    09/17/19 2157  TSH 0.741   Anemia work up Recent Labs    09/17/19 2157  VITAMINB12 355  FOLATE 7.6  FERRITIN 3*  TIBC 534*  IRON 19*  RETICCTPCT 1.7   Urinalysis    Component Value Date/Time   COLORURINE YELLOW 09/17/2019 1355   APPEARANCEUR HAZY (A) 09/17/2019 1355   LABSPEC 1.020 09/17/2019 1355   PHURINE 6.0 09/17/2019 1355   GLUCOSEU 100 (A) 09/17/2019 1355   HGBUR NEGATIVE 09/17/2019 1355   BILIRUBINUR NEGATIVE 09/17/2019 1355   KETONESUR NEGATIVE 09/17/2019 1355   PROTEINUR NEGATIVE 09/17/2019 1355   NITRITE NEGATIVE 09/17/2019 1355   LEUKOCYTESUR NEGATIVE 09/17/2019 1355   Sepsis Labs Invalid input(s): PROCALCITONIN,  WBC,  LACTICIDVEN Microbiology Recent Results (from the past 240 hour(s))  SARS Coronavirus 2 by RT PCR (hospital order, performed in [facility] hospital lab) Nasopharyngeal Nasopharyngeal Swab     Status: None   Collection Time: 09/17/19  4:12 PM   Specimen: Nasopharyngeal Swab  Result Value Ref Range Status   SARS Coronavirus 2 NEGATIVE NEGATIVE Final    Comment:  (NOTE) SARS-CoV-2 target nucleic acids are NOT DETECTED.  The SARS-CoV-2 RNA is generally detectable in upper and lower respiratory specimens during the acute phase of infection. The lowest concentration of SARS-CoV-2 viral copies this assay can detect is 250 copies / mL. A negative result does not preclude SARS-CoV-2 infection and should not be used as the sole basis for treatment or other patient management decisions.  A negative result may occur with improper specimen collection / handling, submission of specimen other than nasopharyngeal swab, presence of viral mutation(s) within the areas targeted by this assay, and inadequate number of viral copies (<250 copies / mL). A negative result must be combined with clinical observations, patient history, and epidemiological information.  Fact Sheet for Patients:   BoilerBrush.com.cyhttps://www.fda.gov/media/136312/download  Fact Sheet for Healthcare Providers:  https://pope.com/  This test is not yet approved or  cleared by the [place] and has been authorized for detection and/or diagnosis of SARS-CoV-2 by FDA under an Emergency Use Authorization (EUA).  This EUA will remain in effect (meaning this test can be used) for the duration of the COVID-19 declaration under Section 564(b)(1) of the Act, 21 U.S.C. section 360bbb-3(b)(1), unless the authorization is terminated or revoked sooner.  Performed at [facility], [address] [place], [place] [address]      Time coordinating discharge:  I have spent 35 minutes face to face with the patient and on the ward discussing the patients care, assessment, plan and disposition with other care givers. >50% of the time was devoted counseling the patient about the risks and benefits of treatment/Discharge disposition and coordinating care.   SIGNED:   Dimple Nanas, MD  [facility] 09/20/2019, 4:48 PM   If 7PM-7AM, please contact  night-coverage

## 2019-09-20 NOTE — Progress Notes (Signed)
Reviewed discharge paperwork, new medication & regimen, and follow up appointments with patient. Patient declined wheelchair escort and walked to main entrance in the care of her sister.

## 2019-11-28 ENCOUNTER — Emergency Department (HOSPITAL_BASED_OUTPATIENT_CLINIC_OR_DEPARTMENT_OTHER)
Admission: EM | Admit: 2019-11-28 | Discharge: 2019-11-28 | Disposition: A | Discharge status: Home / Self Care | Payer: HRSA Program | Attending: Emergency Medicine | Admitting: Emergency Medicine

## 2019-11-28 ENCOUNTER — Encounter (HOSPITAL_BASED_OUTPATIENT_CLINIC_OR_DEPARTMENT_OTHER): Payer: Self-pay

## 2019-11-28 ENCOUNTER — Emergency Department (HOSPITAL_BASED_OUTPATIENT_CLINIC_OR_DEPARTMENT_OTHER): Payer: HRSA Program

## 2019-11-28 ENCOUNTER — Other Ambulatory Visit: Payer: Self-pay

## 2019-11-28 DIAGNOSIS — U071 COVID-19: Secondary | ICD-10-CM

## 2019-11-28 DIAGNOSIS — Z79899 Other long term (current) drug therapy: Secondary | ICD-10-CM | POA: Diagnosis not present

## 2019-11-28 DIAGNOSIS — J449 Chronic obstructive pulmonary disease, unspecified: Secondary | ICD-10-CM | POA: Insufficient documentation

## 2019-11-28 DIAGNOSIS — I10 Essential (primary) hypertension: Secondary | ICD-10-CM | POA: Diagnosis not present

## 2019-11-28 DIAGNOSIS — F17200 Nicotine dependence, unspecified, uncomplicated: Secondary | ICD-10-CM | POA: Insufficient documentation

## 2019-11-28 DIAGNOSIS — R509 Fever, unspecified: Secondary | ICD-10-CM | POA: Diagnosis present

## 2019-11-28 DIAGNOSIS — E119 Type 2 diabetes mellitus without complications: Secondary | ICD-10-CM | POA: Diagnosis not present

## 2019-11-28 LAB — CBC WITH DIFFERENTIAL/PLATELET
Abs Immature Granulocytes: 0.01 10*3/uL (ref 0.00–0.07)
Basophils Absolute: 0 10*3/uL (ref 0.0–0.1)
Basophils Relative: 0 %
Eosinophils Absolute: 0 10*3/uL (ref 0.0–0.5)
Eosinophils Relative: 0 %
HCT: 33.6 % — ABNORMAL LOW (ref 36.0–46.0)
Hemoglobin: 10.4 g/dL — ABNORMAL LOW (ref 12.0–15.0)
Immature Granulocytes: 0 %
Lymphocytes Relative: 22 %
Lymphs Abs: 0.8 10*3/uL (ref 0.7–4.0)
MCH: 25.3 pg — ABNORMAL LOW (ref 26.0–34.0)
MCHC: 31 g/dL (ref 30.0–36.0)
MCV: 81.8 fL (ref 80.0–100.0)
Monocytes Absolute: 0.5 10*3/uL (ref 0.1–1.0)
Monocytes Relative: 14 %
Neutro Abs: 2.5 10*3/uL (ref 1.7–7.7)
Neutrophils Relative %: 64 %
Platelets: 142 10*3/uL — ABNORMAL LOW (ref 150–400)
RBC: 4.11 MIL/uL (ref 3.87–5.11)
RDW: 21 % — ABNORMAL HIGH (ref 11.5–15.5)
WBC: 3.8 10*3/uL — ABNORMAL LOW (ref 4.0–10.5)
nRBC: 0 % (ref 0.0–0.2)

## 2019-11-28 LAB — COMPREHENSIVE METABOLIC PANEL
ALT: 25 U/L (ref 0–44)
AST: 24 U/L (ref 15–41)
Albumin: 3.5 g/dL (ref 3.5–5.0)
Alkaline Phosphatase: 61 U/L (ref 38–126)
Anion gap: 7 (ref 5–15)
BUN: 11 mg/dL (ref 6–20)
CO2: 28 mmol/L (ref 22–32)
Calcium: 9 mg/dL (ref 8.9–10.3)
Chloride: 101 mmol/L (ref 98–111)
Creatinine, Ser: 0.96 mg/dL (ref 0.44–1.00)
GFR calc Af Amer: >60 mL/min (ref >60)
GFR calc non Af Amer: >60 mL/min (ref >60)
Glucose, Bld: 133 mg/dL — ABNORMAL HIGH (ref 70–99)
Potassium: 3.4 mmol/L — ABNORMAL LOW (ref 3.5–5.1)
Sodium: 136 mmol/L (ref 135–145)
Total Bilirubin: 0.4 mg/dL (ref 0.3–1.2)
Total Protein: 6.8 g/dL (ref 6.5–8.1)

## 2019-11-28 LAB — SARS CORONAVIRUS 2 BY RT PCR (HOSPITAL ORDER, PERFORMED IN ~~LOC~~ HOSPITAL LAB): SARS Coronavirus 2: POSITIVE — AB

## 2019-11-28 MED ORDER — ACETAMINOPHEN 500 MG PO TABS
1000.0000 mg | ORAL_TABLET | Freq: Once | ORAL | Status: AC
  Administered 2019-11-28: 1000 mg via ORAL
  Filled 2019-11-28: qty 2

## 2019-11-28 MED ORDER — ONDANSETRON 4 MG PO TBDP
4.0000 mg | ORAL_TABLET | Freq: Three times a day (TID) | ORAL | 0 refills | Status: DC | PRN
Start: 2019-11-28 — End: 2020-04-08

## 2019-11-28 NOTE — ED Provider Notes (Signed)
[facility] EMERGENCY DEPARTMENT Provider Note   CSN: [id number] Arrival date & time: 11/28/19  1757     History Chief Complaint  Patient presents with  . Generalized Body Aches    Grace Collier is a 44 y.o. female.  Patient is a 44 year old female with a history of COPD, diabetes, anemia who is presenting today with 3-day history of general malaise, fever, headache, nausea, vomiting, cough that is nonproductive, diarrhea.  Patient reports that she got 1 Covid vaccine in June but never got the second vaccine.  She does not know if she has had exposure to Covid positive people but works around a lot of people.  She reports she does not take her diabetes or COPD medication regularly because she does not like taking medications.  She reports she is tired, poor appetite and feels drained.  The history is provided by the patient.       Past Medical History:  Diagnosis Date  . COPD (chronic obstructive pulmonary disease) (HCC)   . Diabetes mellitus without complication [facility])     Patient Active Problem List   Diagnosis Date Noted  . Symptomatic anemia 09/17/2019  . Upper abdominal pain 09/17/2019  . Type 2 diabetes mellitus, without long-term current use of insulin (HCC) 12/10/2017  . Left upper lobe pulmonary nodule 11/20/2017  . COPD (chronic obstructive pulmonary disease) (HCC) 11/19/2017  . History of tobacco abuse 11/19/2017  . Essential hypertension 11/18/2017    History reviewed. No pertinent surgical history.   OB History   No obstetric history on file.     No family history on file.  Social History   Tobacco Use  . Smoking status: Current Every Day Smoker  . Smokeless tobacco: Never Used  Vaping Use  . Vaping Use: Never used  Substance Use Topics  . Alcohol use: Never  . Drug use: Not Currently    Home Medications Prior to Admission medications   Medication Sig Start Date End Date Taking? Authorizing Provider  amLODipine (NORVASC) 10 MG  tablet Take 1 tablet (10 mg total) by mouth daily. 09/21/19   Amin, Loura Halt, MD  ferrous sulfate 325 (65 FE) MG tablet Take 1 tablet (325 mg total) by mouth 2 (two) times daily with a meal. 09/20/19   Amin, Loura Halt, MD  lisinopril (ZESTRIL) 5 MG tablet Take 1 tablet (5 mg total) by mouth daily. 09/21/19   Amin, Loura Halt, MD  pantoprazole (PROTONIX) 40 MG tablet Take 1 tablet (40 mg total) by mouth daily before breakfast. 09/20/19   Amin, Loura Halt, MD  polyethylene glycol (MIRALAX / GLYCOLAX) 17 g packet Take 17 g by mouth daily as needed for moderate constipation or severe constipation. 09/20/19   Amin, Loura Halt, MD  senna-docusate (SENOKOT-S) 8.6-50 MG tablet Take 2 tablets by mouth 2 (two) times daily as needed for mild constipation. 09/20/19   Amin, Loura Halt, MD  traZODone (DESYREL) 50 MG tablet Take 0.5 tablets (25 mg total) by mouth at bedtime as needed for sleep. 09/20/19   Dimple Nanas, MD    Allergies    Patient has no known allergies.  Review of Systems   Review of Systems  All other systems reviewed and are negative.   Physical Exam Updated Vital Signs BP (!) 141/83 (BP Location: Right Arm)   Pulse 99   Temp 100.3 F (37.9 C) (Oral)   Resp 18   Ht 5\' 2"  (1.575 m)   Wt 89.8 kg   LMP 10/31/2019  SpO2 95%   BMI 36.21 kg/m   Physical Exam Vitals and nursing note reviewed.  Constitutional:      General: She is not in acute distress.    Appearance: Normal appearance. She is well-developed and normal weight.  HENT:     Head: Normocephalic and atraumatic.     Nose: Congestion present.     Mouth/Throat:     Mouth: Mucous membranes are moist.  Eyes:     Conjunctiva/sclera: Conjunctivae normal.     Pupils: Pupils are equal, round, and reactive to light.  Cardiovascular:     Rate and Rhythm: Regular rhythm. Tachycardia present.     Pulses: Normal pulses.     Heart sounds: Murmur heard.   Pulmonary:     Effort: Pulmonary effort is normal. No  respiratory distress.     Breath sounds: Normal breath sounds. No wheezing or rales.  Chest:     Chest wall: No tenderness.  Abdominal:     General: There is no distension.     Palpations: Abdomen is soft.     Tenderness: There is no abdominal tenderness. There is no guarding or rebound.  Musculoskeletal:        General: No tenderness. Normal range of motion.     Cervical back: Normal range of motion and neck supple.     Right lower leg: No edema.     Left lower leg: No edema.  Skin:    General: Skin is warm and dry.     Findings: No erythema or rash.  Neurological:     General: No focal deficit present.     Mental Status: She is alert and oriented to person, place, and time. Mental status is at baseline.  Psychiatric:        Mood and Affect: Mood normal.        Behavior: Behavior normal.        Thought Content: Thought content normal.     ED Results / Procedures / Treatments   Labs (all labs ordered are listed, but only abnormal results are displayed) Labs Reviewed  SARS CORONAVIRUS 2 BY RT PCR (HOSPITAL ORDER, PERFORMED IN [facility]) - Abnormal; Notable for the following components:      Result Value   SARS Coronavirus 2 POSITIVE (*)    All other components within normal limits  CBC WITH DIFFERENTIAL/PLATELET - Abnormal; Notable for the following components:   WBC 3.8 (*)    Hemoglobin 10.4 (*)    HCT 33.6 (*)    MCH 25.3 (*)    RDW 21.0 (*)    Platelets 142 (*)    All other components within normal limits  COMPREHENSIVE METABOLIC PANEL - Abnormal; Notable for the following components:   Potassium 3.4 (*)    Glucose, Bld 133 (*)    All other components within normal limits    EKG None  Radiology DG Chest Port 1 View  Result Date: 11/28/2019 CLINICAL DATA:  Fever and body aches for 2 days. EXAM: PORTABLE CHEST 1 VIEW COMPARISON:  09/17/2019 and prior radiographs FINDINGS: Cardiomegaly noted. There is no evidence of focal airspace disease,  pulmonary edema, suspicious pulmonary nodule/mass, pleural effusion, or pneumothorax. No acute bony abnormalities are identified. IMPRESSION: Cardiomegaly without evidence of acute cardiopulmonary disease. Electronically Signed   By: Harmon Pier M.D.   On: 11/28/2019 19:32    Procedures Procedures (including critical care time)  Medications Ordered in ED Medications  acetaminophen (TYLENOL) tablet 1,000 mg (has no administration  in time range)    ED Course  I have reviewed the triage vital signs and the nursing notes.  Pertinent labs & imaging results that were available during my care of the patient were reviewed by me and considered in my medical decision making (see chart for details).    MDM Rules/Calculators/A&P                         Pt with symptoms consistent with COVID.  Only partial vaccinated in june.  Well appearing here.  No signs of breathing difficulty and sats are 95% on RA.  No wheezing here to suggest COPD exacerbation.  No signs of pharyngitis, otitis or abnormal abdominal findings.   CXR and labs pending.  COVID pending.  Pt given zofran and tylenol.   8:11 PM COVID positive.  CXR wnl.  Labs at baseline other than mild leukopenia which is most likely from COVID.  Pt set up for outpt infusion.  Will d/c home.  Grace Collier was evaluated in Emergency Department on 11/28/2019 for the symptoms described in the history of present illness. She was evaluated in the context of the global COVID-19 pandemic, which necessitated consideration that the patient might be at risk for infection with the SARS-CoV-2 virus that causes COVID-19. Institutional protocols and algorithms that pertain to the evaluation of patients at risk for COVID-19 are in a state of rapid change based on information released by regulatory bodies including the CDC and federal and state organizations. These policies and algorithms were followed during the patient's care in the ED.   Final Clinical  Impression(s) / ED Diagnoses Final diagnoses:  COVID-19    Rx / DC Orders ED Discharge Orders    None       Gwyneth Sprout, MD 11/28/19 2012

## 2019-11-28 NOTE — Discharge Instructions (Signed)
Someone with the infusion clinic should contact you about infusion with monoclonal antibody.  Stay home and quarentine.  Use tylenol for fever and pain.  Drink plenty of fluids.  Return if you develop shortness of breath.

## 2019-11-28 NOTE — ED Triage Notes (Signed)
Pt arrives with c/o generalized body aches and chills since Friday, unsure of Covid Exposure. Pt reports taking the first Covid shot but reports she did not take the second because the first made her sick. Pt also c/o some NVD.

## 2019-11-28 NOTE — ED Notes (Signed)
ED Provider at bedside. 

## 2019-11-29 ENCOUNTER — Telehealth: Payer: Self-pay | Admitting: Physician Assistant

## 2019-11-29 NOTE — Telephone Encounter (Signed)
Called to discuss with patient about Covid symptoms and the use of casirivimab/imdevimab, a monoclonal antibody infusion for those with mild to moderate Covid symptoms and at a high risk of hospitalization.  Pt is qualified for this infusion at the [facility] due to; Specific high risk criteria : BMI > 25, DMT2, HTN   Message left to call back our hotline [phone].  Cline Crock PA-C  [facility]

## 2019-12-13 ENCOUNTER — Other Ambulatory Visit: Payer: Self-pay

## 2019-12-13 ENCOUNTER — Emergency Department (HOSPITAL_BASED_OUTPATIENT_CLINIC_OR_DEPARTMENT_OTHER): Admission: EM | Admit: 2019-12-13 | Discharge: 2019-12-13 | Discharge status: Absent without leave | Payer: Self-pay

## 2020-04-08 ENCOUNTER — Other Ambulatory Visit: Payer: Self-pay

## 2020-04-08 ENCOUNTER — Encounter (HOSPITAL_BASED_OUTPATIENT_CLINIC_OR_DEPARTMENT_OTHER): Payer: Self-pay | Admitting: Emergency Medicine

## 2020-04-08 ENCOUNTER — Observation Stay (HOSPITAL_BASED_OUTPATIENT_CLINIC_OR_DEPARTMENT_OTHER)
Admission: EM | Admit: 2020-04-08 | Discharge: 2020-04-09 | Disposition: A | Discharge status: Home / Self Care | Payer: Self-pay | Attending: Internal Medicine | Admitting: Internal Medicine

## 2020-04-08 ENCOUNTER — Emergency Department (HOSPITAL_BASED_OUTPATIENT_CLINIC_OR_DEPARTMENT_OTHER): Payer: Self-pay

## 2020-04-08 DIAGNOSIS — D500 Iron deficiency anemia secondary to blood loss (chronic): Secondary | ICD-10-CM | POA: Insufficient documentation

## 2020-04-08 DIAGNOSIS — J441 Chronic obstructive pulmonary disease with (acute) exacerbation: Principal | ICD-10-CM | POA: Insufficient documentation

## 2020-04-08 DIAGNOSIS — Z20822 Contact with and (suspected) exposure to covid-19: Secondary | ICD-10-CM | POA: Insufficient documentation

## 2020-04-08 DIAGNOSIS — R1013 Epigastric pain: Secondary | ICD-10-CM | POA: Insufficient documentation

## 2020-04-08 DIAGNOSIS — F17200 Nicotine dependence, unspecified, uncomplicated: Secondary | ICD-10-CM | POA: Insufficient documentation

## 2020-04-08 DIAGNOSIS — Z8616 Personal history of COVID-19: Secondary | ICD-10-CM | POA: Insufficient documentation

## 2020-04-08 DIAGNOSIS — J181 Lobar pneumonia, unspecified organism: Secondary | ICD-10-CM | POA: Insufficient documentation

## 2020-04-08 DIAGNOSIS — R911 Solitary pulmonary nodule: Secondary | ICD-10-CM | POA: Insufficient documentation

## 2020-04-08 DIAGNOSIS — J449 Chronic obstructive pulmonary disease, unspecified: Secondary | ICD-10-CM | POA: Diagnosis present

## 2020-04-08 DIAGNOSIS — I10 Essential (primary) hypertension: Secondary | ICD-10-CM | POA: Insufficient documentation

## 2020-04-08 DIAGNOSIS — E119 Type 2 diabetes mellitus without complications: Secondary | ICD-10-CM | POA: Insufficient documentation

## 2020-04-08 LAB — HEPATIC FUNCTION PANEL
ALT: 20 U/L (ref 0–44)
AST: 22 U/L (ref 15–41)
Albumin: 3.6 g/dL (ref 3.5–5.0)
Alkaline Phosphatase: 62 U/L (ref 38–126)
Bilirubin, Direct: <0.1 mg/dL (ref 0.0–0.2)
Total Bilirubin: 0.2 mg/dL — ABNORMAL LOW (ref 0.3–1.2)
Total Protein: 7.1 g/dL (ref 6.5–8.1)

## 2020-04-08 LAB — CBC
HCT: 28 % — ABNORMAL LOW (ref 36.0–46.0)
Hemoglobin: 8.3 g/dL — ABNORMAL LOW (ref 12.0–15.0)
MCH: 22.4 pg — ABNORMAL LOW (ref 26.0–34.0)
MCHC: 29.6 g/dL — ABNORMAL LOW (ref 30.0–36.0)
MCV: 75.7 fL — ABNORMAL LOW (ref 80.0–100.0)
Platelets: 144 10*3/uL — ABNORMAL LOW (ref 150–400)
RBC: 3.7 MIL/uL — ABNORMAL LOW (ref 3.87–5.11)
RDW: 18.2 % — ABNORMAL HIGH (ref 11.5–15.5)
WBC: 5 10*3/uL (ref 4.0–10.5)
nRBC: 0 % (ref 0.0–0.2)

## 2020-04-08 LAB — URINALYSIS, ROUTINE W REFLEX MICROSCOPIC
Bacteria, UA: NONE SEEN
Bilirubin Urine: NEGATIVE
Glucose, UA: NEGATIVE mg/dL
Hgb urine dipstick: NEGATIVE
Ketones, ur: 5 mg/dL — AB
Leukocytes,Ua: NEGATIVE
Nitrite: NEGATIVE
Protein, ur: 30 mg/dL — AB
Specific Gravity, Urine: >1.046 — ABNORMAL HIGH (ref 1.005–1.030)
pH: 6 (ref 5.0–8.0)

## 2020-04-08 LAB — RESP PANEL BY RT-PCR (FLU A&B, COVID) ARPGX2
Influenza A by PCR: NEGATIVE
Influenza B by PCR: NEGATIVE
SARS Coronavirus 2 by RT PCR: NEGATIVE

## 2020-04-08 LAB — BASIC METABOLIC PANEL
Anion gap: 8 (ref 5–15)
BUN: 9 mg/dL (ref 6–20)
CO2: 29 mmol/L (ref 22–32)
Calcium: 9 mg/dL (ref 8.9–10.3)
Chloride: 103 mmol/L (ref 98–111)
Creatinine, Ser: 0.99 mg/dL (ref 0.44–1.00)
GFR, Estimated: >60 mL/min (ref >60)
Glucose, Bld: 109 mg/dL — ABNORMAL HIGH (ref 70–99)
Potassium: 3.3 mmol/L — ABNORMAL LOW (ref 3.5–5.1)
Sodium: 140 mmol/L (ref 135–145)

## 2020-04-08 LAB — LIPASE, BLOOD: Lipase: 32 U/L (ref 11–51)

## 2020-04-08 LAB — PREGNANCY, URINE: Preg Test, Ur: NEGATIVE

## 2020-04-08 MED ORDER — IPRATROPIUM-ALBUTEROL 0.5-2.5 (3) MG/3ML IN SOLN
3.0000 mL | Freq: Four times a day (QID) | RESPIRATORY_TRACT | Status: DC
  Administered 2020-04-09 (×3): 3 mL via RESPIRATORY_TRACT
  Filled 2020-04-08 (×3): qty 3

## 2020-04-08 MED ORDER — METHYLPREDNISOLONE SODIUM SUCC 125 MG IJ SOLR
125.0000 mg | Freq: Once | INTRAMUSCULAR | Status: AC
  Administered 2020-04-08: 125 mg via INTRAVENOUS
  Filled 2020-04-08: qty 2

## 2020-04-08 MED ORDER — FERROUS SULFATE 325 (65 FE) MG PO TABS
325.0000 mg | ORAL_TABLET | Freq: Two times a day (BID) | ORAL | Status: DC
  Administered 2020-04-09 (×2): 325 mg via ORAL
  Filled 2020-04-08 (×2): qty 1

## 2020-04-08 MED ORDER — POTASSIUM CHLORIDE CRYS ER 20 MEQ PO TBCR
EXTENDED_RELEASE_TABLET | ORAL | Status: AC
  Administered 2020-04-08: 40 meq via ORAL
  Filled 2020-04-08: qty 2

## 2020-04-08 MED ORDER — METHYLPREDNISOLONE SODIUM SUCC 125 MG IJ SOLR
40.0000 mg | Freq: Four times a day (QID) | INTRAMUSCULAR | Status: AC
  Administered 2020-04-08 – 2020-04-09 (×4): 40 mg via INTRAVENOUS
  Filled 2020-04-08 (×4): qty 2

## 2020-04-08 MED ORDER — DOXYCYCLINE HYCLATE 100 MG PO TABS
100.0000 mg | ORAL_TABLET | Freq: Two times a day (BID) | ORAL | Status: DC
  Administered 2020-04-08 – 2020-04-09 (×2): 100 mg via ORAL
  Filled 2020-04-08 (×2): qty 1

## 2020-04-08 MED ORDER — ALBUTEROL SULFATE HFA 108 (90 BASE) MCG/ACT IN AERS
8.0000 | INHALATION_SPRAY | Freq: Once | RESPIRATORY_TRACT | Status: AC
  Administered 2020-04-08: 8 via RESPIRATORY_TRACT

## 2020-04-08 MED ORDER — ENOXAPARIN SODIUM 40 MG/0.4ML ~~LOC~~ SOLN
40.0000 mg | SUBCUTANEOUS | Status: DC
  Administered 2020-04-08: 40 mg via SUBCUTANEOUS
  Filled 2020-04-08: qty 0.4

## 2020-04-08 MED ORDER — ORAL CARE MOUTH RINSE: 15.0000 mL | Freq: Two times a day (BID) | OROMUCOSAL | Status: DC

## 2020-04-08 MED ORDER — AEROCHAMBER PLUS FLO-VU MEDIUM MISC
1.0000 | Freq: Once | Status: AC
  Administered 2020-04-08: 1
  Filled 2020-04-08: qty 1

## 2020-04-08 MED ORDER — POTASSIUM CHLORIDE CRYS ER 20 MEQ PO TBCR: 40.0000 meq | EXTENDED_RELEASE_TABLET | Freq: Once | ORAL | Status: AC

## 2020-04-08 MED ORDER — ALBUTEROL SULFATE HFA 108 (90 BASE) MCG/ACT IN AERS
2.0000 | INHALATION_SPRAY | RESPIRATORY_TRACT | Status: DC | PRN
  Filled 2020-04-08: qty 6.7

## 2020-04-08 MED ORDER — ONDANSETRON HCL 4 MG PO TABS: 4.0000 mg | ORAL_TABLET | Freq: Four times a day (QID) | ORAL | Status: DC | PRN

## 2020-04-08 MED ORDER — PNEUMOCOCCAL VAC POLYVALENT 25 MCG/0.5ML IJ INJ
0.5000 mL | INJECTION | INTRAMUSCULAR | Status: DC
  Filled 2020-04-08: qty 0.5

## 2020-04-08 MED ORDER — IOHEXOL 350 MG/ML SOLN
100.0000 mL | Freq: Once | INTRAVENOUS | Status: AC | PRN
  Administered 2020-04-08: 100 mL via INTRAVENOUS

## 2020-04-08 MED ORDER — POLYETHYLENE GLYCOL 3350 17 G PO PACK: 17.0000 g | PACK | Freq: Every day | ORAL | Status: DC

## 2020-04-08 MED ORDER — ACETAMINOPHEN 325 MG PO TABS: 650.0000 mg | ORAL_TABLET | Freq: Four times a day (QID) | ORAL | Status: DC | PRN

## 2020-04-08 MED ORDER — PANTOPRAZOLE SODIUM 40 MG PO TBEC
40.0000 mg | DELAYED_RELEASE_TABLET | Freq: Every day | ORAL | Status: DC
  Administered 2020-04-09: 40 mg via ORAL
  Filled 2020-04-08: qty 1

## 2020-04-08 MED ORDER — TRAZODONE HCL 50 MG PO TABS: 25.0000 mg | ORAL_TABLET | Freq: Every evening | ORAL | Status: DC | PRN

## 2020-04-08 MED ORDER — DOCUSATE SODIUM 100 MG PO CAPS: 100.0000 mg | ORAL_CAPSULE | Freq: Two times a day (BID) | ORAL | Status: DC

## 2020-04-08 MED ORDER — NICOTINE 14 MG/24HR TD PT24
14.0000 mg | MEDICATED_PATCH | Freq: Every day | TRANSDERMAL | Status: DC
  Administered 2020-04-09: 14 mg via TRANSDERMAL
  Filled 2020-04-08: qty 1

## 2020-04-08 MED ORDER — PREDNISONE 20 MG PO TABS
40.0000 mg | ORAL_TABLET | Freq: Every day | ORAL | Status: DC
  Filled 2020-04-08: qty 2

## 2020-04-08 MED ORDER — SENNOSIDES-DOCUSATE SODIUM 8.6-50 MG PO TABS: 2.0000 | ORAL_TABLET | Freq: Two times a day (BID) | ORAL | Status: DC | PRN

## 2020-04-08 MED ORDER — ONDANSETRON HCL 4 MG/2ML IJ SOLN: 4.0000 mg | Freq: Four times a day (QID) | INTRAMUSCULAR | Status: DC | PRN

## 2020-04-08 MED ORDER — POTASSIUM CHLORIDE CRYS ER 20 MEQ PO TBCR: 30.0000 meq | EXTENDED_RELEASE_TABLET | Freq: Once | ORAL | Status: DC

## 2020-04-08 MED ORDER — ACETAMINOPHEN 650 MG RE SUPP: 650.0000 mg | Freq: Four times a day (QID) | RECTAL | Status: DC | PRN

## 2020-04-08 MED ORDER — POLYETHYLENE GLYCOL 3350 17 G PO PACK: 17.0000 g | PACK | Freq: Every day | ORAL | Status: DC | PRN

## 2020-04-08 MED ORDER — SENNOSIDES-DOCUSATE SODIUM 8.6-50 MG PO TABS
2.0000 | ORAL_TABLET | Freq: Two times a day (BID) | ORAL | Status: DC
  Administered 2020-04-08 – 2020-04-09 (×2): 2 via ORAL
  Filled 2020-04-08 (×2): qty 2

## 2020-04-08 NOTE — ED Triage Notes (Signed)
Reports having sob, n/v, and abdomen "griping her".  O2 sat also noted to be 84% in triage.  Reports having covid in August.

## 2020-04-08 NOTE — ED Notes (Signed)
Urine was obtained from [facility], which had toilet paper in it; this sample will only be used for preg test

## 2020-04-08 NOTE — ED Notes (Signed)
O2 sat 83-88% on RA while ambulating

## 2020-04-08 NOTE — ED Notes (Signed)
O2 reapplied at 2L 

## 2020-04-08 NOTE — ED Provider Notes (Signed)
[facility] EMERGENCY DEPARTMENT Provider Note   CSN: [id number] Arrival date & time: 04/08/20  1429     History Chief Complaint  Patient presents with  . Nausea  . Shortness of Breath    Grace Collier is a 45 y.o. female with a past medical history significant for COPD and diabetes who presents to the ED due to shortness of breath x4 days.  Patient states she has been experiencing worsening shortness of breath both with exertion and at rest.  Patient has a history of COPD and notes it feels similar to past COPD exacerbations. Shortness of breath associated with wheeze. Denies cough, fever, and chills. She has not used her albuterol in some time. She had COVID back in August. No sick contacts of known COVID exposures. She received her first Pfizer vaccine, but did not get her 2nd dose. Patient also admits to numerous episodes of non-bloody non bilious emesis over the past few days.  She notes her last bowel movement was before Christmas which she states is pretty typical for her.  No previous abdominal operations.  Denies associated abdominal pain.  No treatment prior to arrival.  No aggravating or alleviating symptoms.  Denies chest pain and lower extremity edema.  Denies history of blood clots, recent surgeries, recent long immobilizations, and hormonal treatments.  History obtained from patient and past medical records. No interpreter used during encounter.      Past Medical History:  Diagnosis Date  . COPD (chronic obstructive pulmonary disease) (HCC)   . Diabetes mellitus without complication [facility])     Patient Active Problem List   Diagnosis Date Noted  . Symptomatic anemia 09/17/2019  . Upper abdominal pain 09/17/2019  . Type 2 diabetes mellitus, without long-term current use of insulin (HCC) 12/10/2017  . Left upper lobe pulmonary nodule 11/20/2017  . COPD (chronic obstructive pulmonary disease) (HCC) 11/19/2017  . History of tobacco abuse 11/19/2017  . Essential  hypertension 11/18/2017    History reviewed. No pertinent surgical history.   OB History   No obstetric history on file.     No family history on file.  Social History   Tobacco Use  . Smoking status: Current Every Day Smoker  . Smokeless tobacco: Never Used  Vaping Use  . Vaping Use: Never used  Substance Use Topics  . Alcohol use: Never  . Drug use: Not Currently    Home Medications Prior to Admission medications   Medication Sig Start Date End Date Taking? Authorizing Provider  Aspirin-Salicylamide-Caffeine (BC HEADACHE PO) Take 1 packet by mouth daily as needed (headache).   Yes [provider]    Allergies    Patient has no known allergies.  Review of Systems   Review of Systems  Constitutional: Negative for chills and fever.  Respiratory: Positive for shortness of breath. Negative for cough.   Cardiovascular: Negative for chest pain.  Gastrointestinal: Positive for constipation, nausea and vomiting. Negative for abdominal pain and diarrhea.  Genitourinary: Negative for dysuria.  All other systems reviewed and are negative.   Physical Exam Updated Vital Signs BP (!) 182/96 (BP Location: Right Arm)   Pulse 77   Temp 98 F (36.7 C) (Oral)   Resp (!) 21   Ht 5\' 2"  (1.575 m)   Wt 85.4 kg   SpO2 96%   BMI 34.44 kg/m   Physical Exam Vitals and nursing note reviewed.  Constitutional:      General: She is not in acute distress.    Appearance:  She is not toxic-appearing.  HENT:     Head: Normocephalic.  Eyes:     Pupils: Pupils are equal, round, and reactive to light.  Cardiovascular:     Rate and Rhythm: Normal rate and regular rhythm.     Pulses: Normal pulses.     Heart sounds: Normal heart sounds. No murmur heard. No friction rub. No gallop.   Pulmonary:     Effort: Pulmonary effort is normal.     Breath sounds: Normal breath sounds.     Comments: Wheeze heard throughout. On 2L [place].  Abdominal:     General: Abdomen is flat. Bowel  sounds are normal. There is no distension.     Palpations: Abdomen is soft.     Tenderness: There is no abdominal tenderness. There is no guarding or rebound.     Comments: Abdomen soft, nondistended, nontender to palpation in all quadrants without guarding or peritoneal signs. No rebound. No RUQ tenderness  Musculoskeletal:     Cervical back: Neck supple.     Comments: No lower extremity edema.  Negative calf tenderness bilaterally. Negative homan sign bilaterally.  Skin:    General: Skin is warm and dry.  Neurological:     General: No focal deficit present.     Mental Status: She is alert.  Psychiatric:        Mood and Affect: Mood normal.        Behavior: Behavior normal.     ED Results / Procedures / Treatments   Labs (all labs ordered are listed, but only abnormal results are displayed) Labs Reviewed  BASIC METABOLIC PANEL - Abnormal; Notable for the following components:      Result Value   Potassium 3.3 (*)    Glucose, Bld 109 (*)    All other components within normal limits  CBC - Abnormal; Notable for the following components:   RBC 3.70 (*)    Hemoglobin 8.3 (*)    HCT 28.0 (*)    MCV 75.7 (*)    MCH 22.4 (*)    MCHC 29.6 (*)    RDW 18.2 (*)    Platelets 144 (*)    All other components within normal limits  HEPATIC FUNCTION PANEL - Abnormal; Notable for the following components:   Total Bilirubin 0.2 (*)    All other components within normal limits  URINALYSIS, ROUTINE W REFLEX MICROSCOPIC - Abnormal; Notable for the following components:   Specific Gravity, Urine >1.046 (*)    Ketones, ur 5 (*)    Protein, ur 30 (*)    All other components within normal limits  RESP PANEL BY RT-PCR (FLU A&B, COVID) ARPGX2  LIPASE, BLOOD  PREGNANCY, URINE  COMPREHENSIVE METABOLIC PANEL  CBC  HEMOGLOBIN A1C  IRON AND TIBC  FERRITIN  RETICULOCYTES  TSH  VITAMIN B12    EKG EKG Interpretation  Date/Time:  Saturday April 08 2020 15:13:17 EST Ventricular Rate:   89 PR Interval:  148 QRS Duration: 88 QT Interval:  356 QTC Calculation: 433 R Axis:   58 Text Interpretation: Normal sinus rhythm Minimal voltage criteria for LVH, may be normal variant ( Sokolow-Lyon ) Nonspecific T wave abnormality Abnormal ECG No old tracing to compare Confirmed by Benjiman Core [phone]) on 04/08/2020 6:00:18 PM   Radiology CT ANGIO CHEST PE W OR WO CONTRAST  Result Date: 04/08/2020 CLINICAL DATA:  Shortness of breath nausea vomiting EXAM: CT ANGIOGRAPHY CHEST WITH CONTRAST TECHNIQUE: Multidetector CT imaging of the chest was performed using the standard  protocol during bolus administration of intravenous contrast. Multiplanar CT image reconstructions and MIPs were obtained to evaluate the vascular anatomy. CONTRAST:  OMNIPAQUE IOHEXOL 350 MG/ML SOLN COMPARISON:  Chest x-ray 04/08/2020, CT chest 11/19/2017 FINDINGS: Cardiovascular: Satisfactory opacification of the pulmonary arteries to the segmental level. No evidence of pulmonary embolism. Mild cardiomegaly. No pericardial effusion. Mediastinum/Nodes: Midline trachea. No thyroid mass. No suspicious adenopathy. Esophagus within normal limits Lungs/Pleura: No pleural effusion or pneumothorax. Mild bandlike densities in the bilateral lung bases likely due to scarring. Mild atelectasis or scar at the right middle lobe. Nodular ground-glass densities in the left lower lobe, for example series 3, image number 57. Upper Abdomen: No acute abnormality. Heterogenous hypoenhancement of the spleen with slightly rounded area posteriorly could be due to phase of enhancement. Musculoskeletal: No chest wall abnormality. No acute or significant osseous findings. Review of the MIP images confirms the above findings. IMPRESSION: 1. Negative for acute pulmonary embolus. 2. Nodular ground-glass densities in the left lower lobe, suspect that this is related to respiratory tract infection. These measure up to 7 mm in size. Initial follow-up with  CT at 6-12 months is recommended to confirm persistence. If persistent, repeat CT is recommended every 2 years until 5 years of stability has been established. This recommendation follows the consensus statement: Guidelines for Management of Incidental Pulmonary Nodules Detected on CT Images: From the Fleischner Society 2017; Radiology 2017; 284:228-243. 3. Mild cardiomegaly. 4. Heterogenous enhancement of the spleen with questionable rounded area of hypoenhancement posteriorly at the site of previously questioned abnormality, see MRI 09/20/2019. Electronically Signed   By: Jasmine Pang M.D.   On: 04/08/2020 20:45   CT ABDOMEN PELVIS W CONTRAST  Result Date: 04/08/2020 CLINICAL DATA:  Epigastric pain with nausea and vomiting EXAM: CT ABDOMEN AND PELVIS WITH CONTRAST TECHNIQUE: Multidetector CT imaging of the abdomen and pelvis was performed using the standard protocol following bolus administration of intravenous contrast. CONTRAST:  OMNIPAQUE IOHEXOL 350 MG/ML SOLN COMPARISON:  MRI 09/20/2019, CT 09/19/2019 FINDINGS: Lower chest: Lung bases demonstrate scattered foci of mild linear and bandlike density at the bases possible post infectious scarring. Minimal atelectasis or scar at the right middle lobe. Mild cardiomegaly. Hepatobiliary: No focal hepatic abnormality. Multiple calcified gallstones. No biliary dilatation Pancreas: Unremarkable. No pancreatic ductal dilatation or surrounding inflammatory changes. Spleen: Borderline enlarged at 13.1 cm. Previously noted hypodense lesion in the posterior spleen is not clearly identified today Adrenals/Urinary Tract: Adrenal glands are unremarkable. Kidneys are normal, without renal calculi, focal lesion, or hydronephrosis. Bladder is unremarkable. Stomach/Bowel: Small amount of radiopaque material in the stomach. No dilated small bowel. No acute bowel wall thickening. Negative appendix Vascular/Lymphatic: No significant vascular findings are present. No enlarged  abdominal or pelvic lymph nodes. Reproductive: Rim calcified fibroid within the uterus. No adnexal mass. Other: Negative for free air or free fluid Musculoskeletal: No acute or significant osseous findings. IMPRESSION: 1. No CT evidence for acute intra-abdominal or pelvic abnormality. 2. Gallstones. 3. Borderline splenomegaly Electronically Signed   By: Jasmine Pang M.D.   On: 04/08/2020 20:35   DG Chest Portable 1 View  Result Date: 04/08/2020 CLINICAL DATA:  Shortness of breath EXAM: PORTABLE CHEST 1 VIEW COMPARISON:  None. FINDINGS: Cardiomegaly. No confluent opacities or effusions. No acute bony abnormality. IMPRESSION: Cardiomegaly.  No active disease. Electronically Signed   By: Charlett Nose M.D.   On: 04/08/2020 16:41    Procedures Procedures (including critical care time)  Medications Ordered in ED Medications  pneumococcal 23  valent vaccine (PNEUMOVAX-23) injection 0.5 mL (has no administration in time range)  ferrous sulfate tablet 325 mg (has no administration in time range)  pantoprazole (PROTONIX) EC tablet 40 mg (has no administration in time range)  traZODone (DESYREL) tablet 25 mg (has no administration in time range)  doxycycline (VIBRA-TABS) tablet 100 mg (100 mg Oral Given 04/08/20 2233)  enoxaparin (LOVENOX) injection 40 mg (40 mg Subcutaneous Given 04/08/20 2232)  methylPREDNISolone sodium succinate (SOLU-MEDROL) 125 mg/2 mL injection 40 mg (40 mg Intravenous Given 04/08/20 2231)    Followed by  predniSONE (DELTASONE) tablet 40 mg (has no administration in time range)  ipratropium-albuterol (DUONEB) 0.5-2.5 (3) MG/3ML nebulizer solution 3 mL (has no administration in time range)  acetaminophen (TYLENOL) tablet 650 mg (has no administration in time range)    Or  acetaminophen (TYLENOL) suppository 650 mg (has no administration in time range)  ondansetron (ZOFRAN) tablet 4 mg (has no administration in time range)    Or  ondansetron (ZOFRAN) injection 4 mg (has no  administration in time range)  senna-docusate (Senokot-S) tablet 2 tablet (2 tablets Oral Given 04/08/20 2230)  polyethylene glycol (MIRALAX / GLYCOLAX) packet 17 g (has no administration in time range)  nicotine (NICODERM CQ - dosed in mg/24 hours) patch 14 mg (has no administration in time range)  albuterol (VENTOLIN HFA) 108 (90 Base) MCG/ACT inhaler 8 puff (8 puffs Inhalation Given 04/08/20 1605)  AeroChamber Plus Flo-Vu Medium MISC 1 each (1 each Other Given 04/08/20 1605)  methylPREDNISolone sodium succinate (SOLU-MEDROL) 125 mg/2 mL injection 125 mg (125 mg Intravenous Given 04/08/20 1621)  potassium chloride SA (KLOR-CON) CR tablet 40 mEq (40 mEq Oral Given 04/08/20 1825)  iohexol (OMNIPAQUE) 350 MG/ML injection 100 mL (100 mLs Intravenous Contrast Given 04/08/20 2010)    ED Course  I have reviewed the triage vital signs and the nursing notes.  Pertinent labs & imaging results that were available during my care of the patient were reviewed by me and considered in my medical decision making (see chart for details).    MDM Rules/Calculators/A&P                         45 year old female presents to the ED due to shortness of breath, nausea, and vomiting x4 days. Patient found to be hypoxic in the waiting room at 84% and placed on 2 L nasal cannula.  Patient has a history of COPD and states it feels similar to her past COPD exacerbations.  Upon arrival, patient afebrile, not tachycardic.  Patient in no acute distress and speaking in full sentences.  No accessory muscle usage.  Wheeze heard throughout.  No lower extremity edema.  No clinical signs of DVT on exam. Low suspicion for PE. Will obtain routine labs given nausea and vomiting. CXR to rule out pneumonia. COVID test. Albuterol and steroids started for COPD exacerbation.  BMP significant for mild hypokalemia 3.3.  Potassium repleted here in the ED.  Lipase normal at 32.  Doubt pancreatitis.  Hepatic function panel reassuring.  CBC significant for  anemia with hemoglobin 8.3 which appears around patient's baseline.  No leukocytosis.  Mild thrombocytopenia.  Chest x-ray personally reviewed which demonstrates cardiomegaly, but no signs of infection. Cardiomegaly seen in prior CXRs. EKG personally reviewed which demonstrates normal sinus rhythm no signs of acute ischemia.  5:47 PM reassessed patient at bedside.  Patient now requiring 4 L nasal cannula.  Patient notes improvement in symptoms after albuterol and steroids. Wheeze  heard throughout. COVID/influenza negative.  6:39 PM Patient remains significantly hypoxic, but she notes improvement in symptoms. CTA ordered to rule out PE. Patient tolerating po here in the ED. Abdomen soft, non-distended, and non-tender. Given patient's history of previous history of marked splenic and hepatic iron deposition with numerous episodes of emesis, will also scan abdomen with CTA chest. Discussed case with Dr. Alvino Chapel who agrees with assessment and plan.  CTA personally reviewed which demonstrates: IMPRESSION: 1. Negative for acute pulmonary embolus. 2. Nodular ground-glass densities in the left lower lobe, suspect that this is related to respiratory tract infection. These measure up to 7 mm in size. Initial follow-up with CT at 6-12 months is recommended to confirm persistence. If persistent, repeat CT is recommended every 2 years until 5 years of stability has been established. This recommendation follows the consensus statement: Guidelines for Management of Incidental Pulmonary Nodules Detected on CT Images: From the Fleischner Society 2017; Radiology 2017; 284:228-243. 3. Mild cardiomegaly. 4. Heterogenous enhancement of the spleen with questionable rounded area of hypoenhancement posteriorly at the site of previously questioned abnormality, see MRI 09/20/2019.  CT abdomen personally reviewed which demonstrates borderline splenomegaly with no other abnormalities. Discussed case with Dr. Posey Pronto with  [facility] who agrees to admit patient for further treatment.  Final Clinical Impression(s) / ED Diagnoses Final diagnoses:  COPD exacerbation [facility])    Rx / DC Orders ED Discharge Orders         Ordered    CT ANGIO CHEST PE W OR WO CONTRAST        04/08/20 1837           Karie Kirks 04/08/20 2256    Davonna Belling, MD 04/08/20 2330

## 2020-04-08 NOTE — ED Notes (Signed)
O2 increased to 4L 

## 2020-04-08 NOTE — Progress Notes (Signed)
[facility] Plan of Care Note  Patient: Grace Collier    VFI:[id number]  PCP: Patient, No Pcp Per    DOB: 08-Dec-1975  DOS: 04/08/2020   Received a phone call from Facility: [facility] regarding transfer of Ms. Grace Collier. Requesting: Claudette Stapler, PA Reason for transfer: Admission History: Presents with complaints of 4 days of shortness of breath without any cough or fever or chills.  Also report nausea and vomiting with some abdominal discomfort..  EDP no history of GI bleed symptoms. Vitals: Hypoxic 84 to 89% on room air requiring 4 LPM, tachypneic. Labs: SARS Covid negative, renal function normal, hypokalemic, LFT normal. Hemoglobin 8.3, in August 10.4 but based on the discharge from July 2021 8.4. Chest x-ray negative. Exam: Per EDP no chest pain, no edema, bilateral expiratory wheezing, no abdominal tenderness. Treatment received: Albuterol nebulization.  Plan of care: The patient will be accepted for admission to [facility], telemetry unit. Per EDP current diagnosis of COPD exacerbation. No acute explanation for patient's nausea and vomiting.  No GI bleeding reported by EDP.  EDP reports patient is able to tolerate p.o.  Has chronic constipation. EDP is aware of current bed situation.  Author: Lynden Oxford, MD [facility] 04/08/2020  If 7PM-7AM, please contact night-coverage at [email or web address],

## 2020-04-08 NOTE — H&P (Incomplete)
[facility] History and Physical   Patient: Grace Collier EZM:[id number]   PCP: Patient, No Pcp Per DOB: Mar 12, 1976   DOA: 04/08/2020   DOS: 04/08/2020   DOS: the patient was seen and examined on 04/08/2020***04/08/2020  Patient coming from: The patient is coming from {comingfrom:22515}  Chief Complaint: ***  HPI: Grace Collier is a 45 y.o. female with Past medical history of ***. ***  ED Course: ***   Review of Systems: as mentioned in the history of present illness.  All other systems reviewed and are negative.  Past Medical History:  Diagnosis Date  . COPD (chronic obstructive pulmonary disease) (HCC)   . Diabetes mellitus without complication (HCC)    History reviewed. No pertinent surgical history. Social History:  reports that she has been smoking. She has never used smokeless tobacco. She reports previous drug use. She reports that she does not drink alcohol.  No Known Allergies  *** Family history reviewed and not pertinent No family history on file.   Prior to Admission medications   Medication Sig Start Date End Date Taking? Authorizing Provider  Aspirin-Salicylamide-Caffeine (BC HEADACHE PO) Take 1 packet by mouth daily as needed (headache).   Yes [provider]    Physical Exam: Vitals:   04/08/20 1830 04/08/20 1930 04/08/20 2117 04/08/20 2124  BP: (!) 174/97 (!) 167/91 (!) 182/96   Pulse: 91 78 77   Resp: 19 (!) 23 (!) 21   Temp:   98 F (36.7 C)   TempSrc:   Oral   SpO2: (!) 86% 93% 96%   Weight:    84.7 kg  Height:    5\' 2"  (1.575 m)    General: {EXAM; NEURO ALERTNESS:23097} and {EXAM; NEURO PED ORIENTATION:18734}. Appear in {DEGREE - MILD, MOD, SEV:22033} distress, affect {Exam; psychiatric affect:30301} Eyes: PERRL***, Conjunctiva normal ENT: Oral Mucosa {oral exam:22516}  Neck: *** JVD, *** Abnormal Mass Or lumps Cardiovascular: S1 and S2 Present, *** Murmur, {peripheral pulses:323011::"peripheral pulses  symmetrical"} Respiratory: {Desc; increased/descreased:10091} respiratory effort, Bilateral Air entry equal and Decreased, {Signs/no:32574} of accessory muscle use, ***Clear to Auscultation, *** Crackles, *** wheezes Abdomen: Bowel Sound ***sent, Soft and *** tenderness, *** hernia Skin: {skin exampmp:22769} *** Extremities: *** Pedal edema, *** calf tenderness Neurologic: {neuro detail prnv:22517::"without any new focal findings"} Gait not checked due to patient safety concerns  Data Reviewed: I have personally reviewed and interpreted labs, imaging as discussed below.  CBC: Recent Labs  Lab 04/08/20 1528  WBC 5.0  HGB 8.3*  HCT 28.0*  MCV 75.7*  PLT 144*   Basic Metabolic Panel: Recent Labs  Lab 04/08/20 1528  NA 140  K 3.3*  CL 103  CO2 29  GLUCOSE 109*  BUN 9  CREATININE 0.99  CALCIUM 9.0   GFR: Estimated Creatinine Clearance: 72.4 mL/min (by C-G formula based on SCr of 0.99 mg/dL). Liver Function Tests: Recent Labs  Lab 04/08/20 1528  AST 22  ALT 20  ALKPHOS 62  BILITOT 0.2*  PROT 7.1  ALBUMIN 3.6   Recent Labs  Lab 04/08/20 1528  LIPASE 32   No results for input(s): AMMONIA in the last 168 hours. Coagulation Profile: No results for input(s): INR, PROTIME in the last 168 hours. Cardiac Enzymes: No results for input(s): CKTOTAL, CKMB, CKMBINDEX, TROPONINI in the last 168 hours. BNP (last 3 results) No results for input(s): PROBNP in the last 8760 hours. HbA1C: No results for input(s): HGBA1C in the last 72 hours. CBG: No results for input(s): GLUCAP in the last  168 hours. Lipid Profile: No results for input(s): CHOL, HDL, LDLCALC, TRIG, CHOLHDL, LDLDIRECT in the last 72 hours. Thyroid Function Tests: No results for input(s): TSH, T4TOTAL, FREET4, T3FREE, THYROIDAB in the last 72 hours. Anemia Panel: No results for input(s): VITAMINB12, FOLATE, FERRITIN, TIBC, IRON, RETICCTPCT in the last 72 hours. Urine analysis:    Component Value Date/Time    COLORURINE YELLOW 04/08/2020 1935   APPEARANCEUR CLEAR 04/08/2020 1935   LABSPEC >1.046 (H) 04/08/2020 1935   PHURINE 6.0 04/08/2020 1935   GLUCOSEU NEGATIVE 04/08/2020 [place] NEGATIVE 04/08/2020 [place] NEGATIVE 04/08/2020 1935   KETONESUR 5 (A) 04/08/2020 1935   PROTEINUR 30 (A) 04/08/2020 1935   NITRITE NEGATIVE 04/08/2020 1935   LEUKOCYTESUR NEGATIVE 04/08/2020 1935    Radiological Exams on Admission: CT ANGIO CHEST PE W OR WO CONTRAST  Result Date: 04/08/2020 CLINICAL DATA:  Shortness of breath nausea vomiting EXAM: CT ANGIOGRAPHY CHEST WITH CONTRAST TECHNIQUE: Multidetector CT imaging of the chest was performed using the standard protocol during bolus administration of intravenous contrast. Multiplanar CT image reconstructions and MIPs were obtained to evaluate the vascular anatomy. CONTRAST:  177mL OMNIPAQUE IOHEXOL 350 MG/ML SOLN COMPARISON:  Chest x-ray 04/08/2020, CT chest 11/19/2017 FINDINGS: Cardiovascular: Satisfactory opacification of the pulmonary arteries to the segmental level. No evidence of pulmonary embolism. Mild cardiomegaly. No pericardial effusion. Mediastinum/Nodes: Midline trachea. No thyroid mass. No suspicious adenopathy. Esophagus within normal limits Lungs/Pleura: No pleural effusion or pneumothorax. Mild bandlike densities in the bilateral lung bases likely due to scarring. Mild atelectasis or scar at the right middle lobe. Nodular ground-glass densities in the left lower lobe, for example series 3, image number 57. Upper Abdomen: No acute abnormality. Heterogenous hypoenhancement of the spleen with slightly rounded area posteriorly could be due to phase of enhancement. Musculoskeletal: No chest wall abnormality. No acute or significant osseous findings. Review of the MIP images confirms the above findings. IMPRESSION: 1. Negative for acute pulmonary embolus. 2. Nodular ground-glass densities in the left lower lobe, suspect that this is related to  respiratory tract infection. These measure up to 7 mm in size. Initial follow-up with CT at 6-12 months is recommended to confirm persistence. If persistent, repeat CT is recommended every 2 years until 5 years of stability has been established. This recommendation follows the consensus statement: Guidelines for Management of Incidental Pulmonary Nodules Detected on CT Images: From the Fleischner Society 2017; Radiology 2017; 284:228-243. 3. Mild cardiomegaly. 4. Heterogenous enhancement of the spleen with questionable rounded area of hypoenhancement posteriorly at the site of previously questioned abnormality, see MRI 09/20/2019. Electronically Signed   By: Donavan Foil M.D.   On: 04/08/2020 20:45   CT ABDOMEN PELVIS W CONTRAST  Result Date: 04/08/2020 CLINICAL DATA:  Epigastric pain with nausea and vomiting EXAM: CT ABDOMEN AND PELVIS WITH CONTRAST TECHNIQUE: Multidetector CT imaging of the abdomen and pelvis was performed using the standard protocol following bolus administration of intravenous contrast. CONTRAST:  148mL OMNIPAQUE IOHEXOL 350 MG/ML SOLN COMPARISON:  MRI 09/20/2019, CT 09/19/2019 FINDINGS: Lower chest: Lung bases demonstrate scattered foci of mild linear and bandlike density at the bases possible post infectious scarring. Minimal atelectasis or scar at the right middle lobe. Mild cardiomegaly. Hepatobiliary: No focal hepatic abnormality. Multiple calcified gallstones. No biliary dilatation Pancreas: Unremarkable. No pancreatic ductal dilatation or surrounding inflammatory changes. Spleen: Borderline enlarged at 13.1 cm. Previously noted hypodense lesion in the posterior spleen is not clearly identified today Adrenals/Urinary Tract: Adrenal glands are unremarkable. Kidneys  are normal, without renal calculi, focal lesion, or hydronephrosis. Bladder is unremarkable. Stomach/Bowel: Small amount of radiopaque material in the stomach. No dilated small bowel. No acute bowel wall thickening. Negative  appendix Vascular/Lymphatic: No significant vascular findings are present. No enlarged abdominal or pelvic lymph nodes. Reproductive: Rim calcified fibroid within the uterus. No adnexal mass. Other: Negative for free air or free fluid Musculoskeletal: No acute or significant osseous findings. IMPRESSION: 1. No CT evidence for acute intra-abdominal or pelvic abnormality. 2. Gallstones. 3. Borderline splenomegaly Electronically Signed   By: Jasmine Pang M.D.   On: 04/08/2020 20:35   DG Chest Portable 1 View  Result Date: 04/08/2020 CLINICAL DATA:  Shortness of breath EXAM: PORTABLE CHEST 1 VIEW COMPARISON:  None. FINDINGS: Cardiomegaly. No confluent opacities or effusions. No acute bony abnormality. IMPRESSION: Cardiomegaly.  No active disease. Electronically Signed   By: Charlett Nose M.D.   On: 04/08/2020 16:41   ***EKG: Independently reviewed. {ekg findings:315101::"normal EKG, normal sinus rhythm","unchanged from previous tracings"}. ***Echocardiogram: ***  I reviewed all nursing notes, pharmacy notes, vitals, pertinent old records.  Assessment/Plan 1. <principal problem not specified> ***  2.***  ***  Active Problems:   COPD (chronic obstructive pulmonary disease) (HCC)   3. ***Type 2 Diabetes Mellitus, {DESC; WELL/MODERATELY/POORLY:30679} controled with*** complication last hemoglobin A1c was ***, ***Check hemoglobin A1c. Blood glucose {DESC; WELL/MODERATELY/POORLY:30679}  -***hold her oral hypoglycemic agents. -On insulin sliding scale moderate***sensitive***resistant ***with Basal insulin ***.  4.***{afib:23014} CHA2DS2-VASc Score 1 point each [CHF, HTN, DM, Vascular=MI/PAD/Aortic Plaque, Age if 37-74, or Female] 2 points each [Age > 75, or Stroke/TIA/TE]  -Patient is currently in *** at *** rate. -We'll continue ***   Nutrition: {dietplan:22518} DVT Prophylaxis: {DVTprophylaxisprnv:22509}  Advance goals of care discussion: {Palliative Code status:23503} ***  Consults:  ***I personally Discussed with ***   Family Communication: ***family was present at bedside, at the time of interview.  ***Opportunity was given to ask question and all questions were answered satisfactorily.   Disposition:  From: {comingfrom:22515} Likely will need {comingfrom:22515} on discharge.   Author: Lynden Oxford, MD [facility] 04/08/2020 11:59 PM   To reach On-call, see care teams to locate the attending and reach out to them via [email or web address]. If 7PM-7AM, please contact night-coverage If you still have difficulty reaching the attending provider, please page the [facility] (Director on Call) for [facility] on amion for assistance.

## 2020-04-08 NOTE — Progress Notes (Signed)
RT to assess pt. Pt appears in no distress, SpO2 on room air 87-88%. Pt placed on 2L nasal cannula, SpO2 rose to 93%. Pt with diminished breath sounds and scattered wheezing throughout. PRN Albuterol inhaler ordered and RT will give pt 2p as ordered. RT will continue to monitor and be available as needed.

## 2020-04-09 DIAGNOSIS — J449 Chronic obstructive pulmonary disease, unspecified: Secondary | ICD-10-CM

## 2020-04-09 DIAGNOSIS — I10 Essential (primary) hypertension: Secondary | ICD-10-CM

## 2020-04-09 DIAGNOSIS — R911 Solitary pulmonary nodule: Secondary | ICD-10-CM

## 2020-04-09 LAB — FERRITIN: Ferritin: 6 ng/mL — ABNORMAL LOW (ref 11–307)

## 2020-04-09 LAB — COMPREHENSIVE METABOLIC PANEL
ALT: 21 U/L (ref 0–44)
AST: 20 U/L (ref 15–41)
Albumin: 3.8 g/dL (ref 3.5–5.0)
Alkaline Phosphatase: 67 U/L (ref 38–126)
Anion gap: 10 (ref 5–15)
BUN: 12 mg/dL (ref 6–20)
CO2: 26 mmol/L (ref 22–32)
Calcium: 9.5 mg/dL (ref 8.9–10.3)
Chloride: 104 mmol/L (ref 98–111)
Creatinine, Ser: 0.82 mg/dL (ref 0.44–1.00)
GFR, Estimated: >60 mL/min (ref >60)
Glucose, Bld: 179 mg/dL — ABNORMAL HIGH (ref 70–99)
Potassium: 4.1 mmol/L (ref 3.5–5.1)
Sodium: 140 mmol/L (ref 135–145)
Total Bilirubin: 0.1 mg/dL — ABNORMAL LOW (ref 0.3–1.2)
Total Protein: 7.5 g/dL (ref 6.5–8.1)

## 2020-04-09 LAB — VITAMIN B12: Vitamin B-12: 527 pg/mL (ref 180–914)

## 2020-04-09 LAB — CBC
HCT: 28.7 % — ABNORMAL LOW (ref 36.0–46.0)
Hemoglobin: 8.2 g/dL — ABNORMAL LOW (ref 12.0–15.0)
MCH: 21.8 pg — ABNORMAL LOW (ref 26.0–34.0)
MCHC: 28.6 g/dL — ABNORMAL LOW (ref 30.0–36.0)
MCV: 76.3 fL — ABNORMAL LOW (ref 80.0–100.0)
Platelets: 154 10*3/uL (ref 150–400)
RBC: 3.76 MIL/uL — ABNORMAL LOW (ref 3.87–5.11)
RDW: 18.3 % — ABNORMAL HIGH (ref 11.5–15.5)
WBC: 6.4 10*3/uL (ref 4.0–10.5)
nRBC: 0.3 % — ABNORMAL HIGH (ref 0.0–0.2)

## 2020-04-09 LAB — IRON AND TIBC
Iron: 21 ug/dL — ABNORMAL LOW (ref 28–170)
Saturation Ratios: 4 % — ABNORMAL LOW (ref 10.4–31.8)
TIBC: 516 ug/dL — ABNORMAL HIGH (ref 250–450)
UIBC: 495 ug/dL

## 2020-04-09 LAB — HEMOGLOBIN A1C
Hgb A1c MFr Bld: 6.2 % — ABNORMAL HIGH (ref 4.8–5.6)
Mean Plasma Glucose: 131.24 mg/dL

## 2020-04-09 LAB — RETICULOCYTES
Immature Retic Fract: 18.6 % — ABNORMAL HIGH (ref 2.3–15.9)
RBC.: 3.7 MIL/uL — ABNORMAL LOW (ref 3.87–5.11)
Retic Count, Absolute: 77.7 10*3/uL (ref 19.0–186.0)
Retic Ct Pct: 2.1 % (ref 0.4–3.1)

## 2020-04-09 LAB — TSH: TSH: 0.235 u[IU]/mL — ABNORMAL LOW (ref 0.350–4.500)

## 2020-04-09 MED ORDER — ALBUTEROL SULFATE HFA 108 (90 BASE) MCG/ACT IN AERS: 2.0000 | INHALATION_SPRAY | Freq: Four times a day (QID) | RESPIRATORY_TRACT | 2 refills | Status: AC | PRN

## 2020-04-09 MED ORDER — POLYETHYLENE GLYCOL 3350 17 G PO PACK: 17.0000 g | PACK | Freq: Every day | ORAL | 0 refills | Status: AC | PRN

## 2020-04-09 MED ORDER — DOXYCYCLINE HYCLATE 100 MG PO TABS
100.0000 mg | ORAL_TABLET | Freq: Two times a day (BID) | ORAL | 0 refills | Status: AC
Start: 2020-04-09 — End: ?

## 2020-04-09 MED ORDER — HYDROCHLOROTHIAZIDE 12.5 MG PO CAPS: 12.5000 mg | ORAL_CAPSULE | Freq: Every day | ORAL | 2 refills | Status: AC

## 2020-04-09 MED ORDER — NICOTINE 14 MG/24HR TD PT24: 14.0000 mg | MEDICATED_PATCH | Freq: Every day | TRANSDERMAL | 0 refills | Status: AC

## 2020-04-09 MED ORDER — PANTOPRAZOLE SODIUM 40 MG PO TBEC: 40.0000 mg | DELAYED_RELEASE_TABLET | Freq: Every day | ORAL | 0 refills | Status: AC

## 2020-04-09 MED ORDER — PREDNISONE 20 MG PO TABS: 40.0000 mg | ORAL_TABLET | Freq: Every day | ORAL | 0 refills | Status: AC

## 2020-04-09 MED ORDER — IPRATROPIUM-ALBUTEROL 0.5-2.5 (3) MG/3ML IN SOLN: 3.0000 mL | Freq: Two times a day (BID) | RESPIRATORY_TRACT | Status: DC

## 2020-04-09 NOTE — Discharge Summary (Signed)
Physician Discharge Summary  Sabrinia Prien QHU:[id number] DOB: Feb 08, 1976 DOA: 04/08/2020  PCP: Patient, No Pcp Per  Admit date: 04/08/2020 Discharge date: 04/09/2020  Admitted From: Home  Discharge disposition: Home    Recommendations for Outpatient Follow-Up:   . Follow up with your primary care provider in one week.  . Check CBC, BMP, magnesium in the next visit . Regarding groundglass opacity -initial follow-up with CT at 6-12 months is recommended to confirm persistence. If persistent, repeat CT is recommended every 2 years until 5 years of stability has been established   Discharge Diagnosis:   Principal Problem:   COPD (chronic obstructive pulmonary disease) (HCC) Active Problems:   Essential hypertension   Left upper lobe pulmonary nodule   Discharge Condition: Improved.  Diet recommendation: Low sodium, heart healthy.   Wound care: None.  Code status: Full.   History of Present Illness:   Nikcole Eischeid is a 45 y.o. female with past medical history of COPD, iron deficiency anemia with microcytosis, active smoker, type II DM, obesity, HTN, splenic lesion presented to the hospital with complaints of cough and shortness of breath  for 4 days along with dizziness and lightheadedness. On presentation patient was hypoxic 88% on room air.   SARS Covid negative.  Patient was then considered for admission to the hospital for COPD with mild pneumonia.   Hospital Course:   Following conditions were addressed during hospitalization as listed below,   Acute COPD exacerbation with mild left lower lobe pneumonia. Patient received IV Solu-Medrol bronchodilators and oral doxycycline. Has improved at this time. Has been weaned off oxygen.CT scan negative for PE. Recommendation is for a repeat CT scan in 6 to 12 months to ensure resolution of nodular lesions.   Active smoker Counseled to quit smoking. Nicotine patch will be prescribed on discharge  Abdominal pain with nausea  and vomiting. Nonacute findings on the CT scan.  Chronic microcytic anemia. Need outpatient follow-up.  Obesity. Would benefit from ongoing weight loss as outpatient   Essential hypertension Would benefit from initiation of antihypertensive. Start with low-dose hydrochlorothiazide on discharge.  Disposition.  At this time, patient is stable for disposition home. Need to follow-up with your primary care physician in 1 week.  Medical Consultants:    None.  Procedures:    None Subjective:   Today, patient seen and examined at bedside. Denies chest pain, fever, chills or rigor.  Discharge Exam:   Vitals:   04/09/20 1101 04/09/20 1331  BP:  (!) 163/94  Pulse:  84  Resp:  18  Temp:  98 F (36.7 C)  SpO2: 90% (!) 88%   Vitals:   04/09/20 0624 04/09/20 0828 04/09/20 1101 04/09/20 1331  BP: (!) 180/98   (!) 163/94  Pulse: 82   84  Resp: 20   18  Temp:    98 F (36.7 C)  TempSrc:    Oral  SpO2: 95% 95% 90% (!) 88%  Weight:      Height:        General: Alert awake, not in obvious distress HENT: pupils equally reacting to light,  No scleral pallor or icterus noted. Oral mucosa is moist.  Chest:    Diminished breath sounds bilaterally. Coarse breath sounds noted CVS: S1 &S2 heard. No murmur.  Regular rate and rhythm. Abdomen: Soft, nontender, nondistended.  Bowel sounds are heard.   Extremities: No cyanosis, clubbing with trace edema peripheral pulses are palpable. Psych: Alert, awake and oriented, normal mood CNS:  No cranial  nerve deficits.  Power equal in all extremities.   Skin: Warm and dry.  No rashes noted.  The results of significant diagnostics from this hospitalization (including imaging, microbiology, ancillary and laboratory) are listed below for reference.     Diagnostic Studies:   CT ANGIO CHEST PE W OR WO CONTRAST  Result Date: 04/08/2020 CLINICAL DATA:  Shortness of breath nausea vomiting EXAM: CT ANGIOGRAPHY CHEST WITH CONTRAST TECHNIQUE:  Multidetector CT imaging of the chest was performed using the standard protocol during bolus administration of intravenous contrast. Multiplanar CT image reconstructions and MIPs were obtained to evaluate the vascular anatomy. CONTRAST:  OMNIPAQUE IOHEXOL 350 MG/ML SOLN COMPARISON:  Chest x-ray 04/08/2020, CT chest 11/19/2017 FINDINGS: Cardiovascular: Satisfactory opacification of the pulmonary arteries to the segmental level. No evidence of pulmonary embolism. Mild cardiomegaly. No pericardial effusion. Mediastinum/Nodes: Midline trachea. No thyroid mass. No suspicious adenopathy. Esophagus within normal limits Lungs/Pleura: No pleural effusion or pneumothorax. Mild bandlike densities in the bilateral lung bases likely due to scarring. Mild atelectasis or scar at the right middle lobe. Nodular ground-glass densities in the left lower lobe, for example series 3, image number 57. Upper Abdomen: No acute abnormality. Heterogenous hypoenhancement of the spleen with slightly rounded area posteriorly could be due to phase of enhancement. Musculoskeletal: No chest wall abnormality. No acute or significant osseous findings. Review of the MIP images confirms the above findings. IMPRESSION: 1. Negative for acute pulmonary embolus. 2. Nodular ground-glass densities in the left lower lobe, suspect that this is related to respiratory tract infection. These measure up to 7 mm in size. Initial follow-up with CT at 6-12 months is recommended to confirm persistence. If persistent, repeat CT is recommended every 2 years until 5 years of stability has been established. This recommendation follows the consensus statement: Guidelines for Management of Incidental Pulmonary Nodules Detected on CT Images: From the Fleischner Society 2017; Radiology 2017; 284:228-243. 3. Mild cardiomegaly. 4. Heterogenous enhancement of the spleen with questionable rounded area of hypoenhancement posteriorly at the site of previously questioned  abnormality, see MRI 09/20/2019. Electronically Signed   By: Jasmine Pang M.D.   On: 04/08/2020 20:45   CT ABDOMEN PELVIS W CONTRAST  Result Date: 04/08/2020 CLINICAL DATA:  Epigastric pain with nausea and vomiting EXAM: CT ABDOMEN AND PELVIS WITH CONTRAST TECHNIQUE: Multidetector CT imaging of the abdomen and pelvis was performed using the standard protocol following bolus administration of intravenous contrast. CONTRAST:  OMNIPAQUE IOHEXOL 350 MG/ML SOLN COMPARISON:  MRI 09/20/2019, CT 09/19/2019 FINDINGS: Lower chest: Lung bases demonstrate scattered foci of mild linear and bandlike density at the bases possible post infectious scarring. Minimal atelectasis or scar at the right middle lobe. Mild cardiomegaly. Hepatobiliary: No focal hepatic abnormality. Multiple calcified gallstones. No biliary dilatation Pancreas: Unremarkable. No pancreatic ductal dilatation or surrounding inflammatory changes. Spleen: Borderline enlarged at 13.1 cm. Previously noted hypodense lesion in the posterior spleen is not clearly identified today Adrenals/Urinary Tract: Adrenal glands are unremarkable. Kidneys are normal, without renal calculi, focal lesion, or hydronephrosis. Bladder is unremarkable. Stomach/Bowel: Small amount of radiopaque material in the stomach. No dilated small bowel. No acute bowel wall thickening. Negative appendix Vascular/Lymphatic: No significant vascular findings are present. No enlarged abdominal or pelvic lymph nodes. Reproductive: Rim calcified fibroid within the uterus. No adnexal mass. Other: Negative for free air or free fluid Musculoskeletal: No acute or significant osseous findings. IMPRESSION: 1. No CT evidence for acute intra-abdominal or pelvic abnormality. 2. Gallstones. 3. Borderline splenomegaly Electronically Signed  By: Donavan Foil M.D.   On: 04/08/2020 20:35   DG Chest Portable 1 View  Result Date: 04/08/2020 CLINICAL DATA:  Shortness of breath EXAM: PORTABLE CHEST 1 VIEW  COMPARISON:  None. FINDINGS: Cardiomegaly. No confluent opacities or effusions. No acute bony abnormality. IMPRESSION: Cardiomegaly.  No active disease. Electronically Signed   By: Rolm Baptise M.D.   On: 04/08/2020 16:41     Labs:   Basic Metabolic Panel: Recent Labs  Lab 04/08/20 1528 04/09/20 0417  NA 140 140  K 3.3* 4.1  CL 103 104  CO2 29 26  GLUCOSE 109* 179*  BUN 9 12  CREATININE 0.99 0.82  CALCIUM 9.0 9.5   GFR Estimated Creatinine Clearance: 87.5 mL/min (by C-G formula based on SCr of 0.82 mg/dL). Liver Function Tests: Recent Labs  Lab 04/08/20 1528 04/09/20 0417  AST 22 20  ALT 20 21  ALKPHOS 62 67  BILITOT 0.2* 0.1*  PROT 7.1 7.5  ALBUMIN 3.6 3.8   Recent Labs  Lab 04/08/20 1528  LIPASE 32   No results for input(s): AMMONIA in the last 168 hours. Coagulation profile No results for input(s): INR, PROTIME in the last 168 hours.  CBC: Recent Labs  Lab 04/08/20 1528 04/09/20 0417  WBC 5.0 6.4  HGB 8.3* 8.2*  HCT 28.0* 28.7*  MCV 75.7* 76.3*  PLT 144* 154   Cardiac Enzymes: No results for input(s): CKTOTAL, CKMB, CKMBINDEX, TROPONINI in the last 168 hours. BNP: Invalid input(s): POCBNP CBG: No results for input(s): GLUCAP in the last 168 hours. D-Dimer No results for input(s): DDIMER in the last 72 hours. Hgb A1c Recent Labs    04/09/20 0417  HGBA1C 6.2*   Lipid Profile No results for input(s): CHOL, HDL, LDLCALC, TRIG, CHOLHDL, LDLDIRECT in the last 72 hours. Thyroid function studies Recent Labs    04/09/20 0417  TSH 0.235*   Anemia work up Recent Labs    04/09/20 0417  VITAMINB12 527  FERRITIN 6*  TIBC 516*  IRON 21*  RETICCTPCT 2.1   Microbiology Recent Results (from the past 240 hour(s))  Resp Panel by RT-PCR (Flu A&B, Covid) Nasopharyngeal Swab     Status: None   Collection Time: 04/08/20  4:19 PM   Specimen: Nasopharyngeal Swab; Nasopharyngeal(NP) swabs in vial transport medium  Result Value Ref Range Status    SARS Coronavirus 2 by RT PCR NEGATIVE NEGATIVE Final    Comment: (NOTE) SARS-CoV-2 target nucleic acids are NOT DETECTED.  The SARS-CoV-2 RNA is generally detectable in upper respiratory specimens during the acute phase of infection. The lowest concentration of SARS-CoV-2 viral copies this assay can detect is 138 copies/mL. A negative result does not preclude SARS-Cov-2 infection and should not be used as the sole basis for treatment or other patient management decisions. A negative result may occur with  improper specimen collection/handling, submission of specimen other than nasopharyngeal swab, presence of viral mutation(s) within the areas targeted by this assay, and inadequate number of viral copies(<138 copies/mL). A negative result must be combined with clinical observations, patient history, and epidemiological information. The expected result is Negative.  Fact Sheet for Patients:  EntrepreneurPulse.com.au  Fact Sheet for Healthcare Providers:  IncredibleEmployment.be  This test is no t yet approved or cleared by the [place] FDA and  has been authorized for detection and/or diagnosis of SARS-CoV-2 by FDA under an Emergency Use Authorization (EUA). This EUA will remain  in effect (meaning this test can be used) for the duration of the  COVID-19 declaration under Section 564(b)(1) of the Act, 21 U.S.C.section 360bbb-3(b)(1), unless the authorization is terminated  or revoked sooner.       Influenza A by PCR NEGATIVE NEGATIVE Final   Influenza B by PCR NEGATIVE NEGATIVE Final    Comment: (NOTE) The Xpert Xpress SARS-CoV-2/FLU/RSV plus assay is intended as an aid in the diagnosis of influenza from Nasopharyngeal swab specimens and should not be used as a sole basis for treatment. Nasal washings and aspirates are unacceptable for Xpert Xpress SARS-CoV-2/FLU/RSV testing.  Fact Sheet for  Patients: BloggerCourse.com  Fact Sheet for Healthcare Providers: SeriousBroker.it  This test is not yet approved or cleared by the [place] FDA and has been authorized for detection and/or diagnosis of SARS-CoV-2 by FDA under an Emergency Use Authorization (EUA). This EUA will remain in effect (meaning this test can be used) for the duration of the COVID-19 declaration under Section 564(b)(1) of the Act, 21 U.S.C. section 360bbb-3(b)(1), unless the authorization is terminated or revoked.  Performed at [facility], [address] [place], [place] [address]      Discharge Instructions:   Discharge Instructions    Diet - low sodium heart healthy   Complete by: As directed    Discharge instructions   Complete by: As directed    Follow-up with your primary care physician in 1 week.  You have been prescribed nicotine patch to help you quit smoking.  Please consider quitting smoking.  Complete the course of antibiotic and prednisone.  Please seek medical attention if you have worsening symptoms.  You will need to get a CT scan in 6 -12 months monitor the opacity in the lungs.   Increase activity slowly   Complete by: As directed      Allergies as of 04/09/2020   No Known Allergies     Medication List    TAKE these medications   albuterol 108 (90 Base) MCG/ACT inhaler Commonly known as: VENTOLIN HFA Inhale 2 puffs into the lungs every 6 (six) hours as needed for wheezing or shortness of breath.   BC HEADACHE PO Take 1 packet by mouth daily as needed (headache).   doxycycline 100 MG tablet Commonly known as: VIBRA-TABS Take 1 tablet (100 mg total) by mouth every 12 (twelve) hours.   hydrochlorothiazide 12.5 MG capsule Commonly known as: Microzide Take 1 capsule (12.5 mg total) by mouth daily.   nicotine 14 mg/24hr patch Commonly known as: NICODERM CQ - dosed in mg/24 hours Place 1 patch (14 mg total) onto  the skin daily. Start taking on: April 10, 2020   pantoprazole 40 MG tablet Commonly known as: PROTONIX Take 1 tablet (40 mg total) by mouth daily before breakfast.   polyethylene glycol 17 g packet Commonly known as: MIRALAX / GLYCOLAX Take 17 g by mouth daily as needed for moderate constipation or severe constipation.   predniSONE 20 MG tablet Commonly known as: DELTASONE Take 2 tablets (40 mg total) by mouth daily with breakfast for 5 days. Start taking on: April 10, 2020                Time coordinating discharge: 39 minutes  Signed:  Destin Vinsant  [facility] 04/09/2020, 2:17 PM

## 2020-04-09 NOTE — Plan of Care (Signed)
  Problem: Clinical Measurements: Goal: Respiratory complications will improve Outcome: Adequate for Discharge   

## 2020-04-09 NOTE — H&P (Signed)
[facility] History and Physical   Patient: Grace Collier WGN:[id number]   PCP: Patient, No Pcp Per DOB: 02-05-1976   DOA: 04/08/2020   DOS: 04/08/2020   DOS: the patient was seen and examined on 04/08/2020  Patient coming from: The patient is coming from Home  Chief Complaint: Cough and shortness of breath  HPI: Grace Collier is a 45 y.o. female with Past medical history of COPD, iron deficiency anemia with microcytosis, active smoker, type II DM, obesity, HTN, splenic lesion. Patient presented with complaints of cough and shortness of breath ongoing for 4 days along with dizziness and lightheadedness.  She also had some complaints of abdominal pain with nausea no vomiting.  No fever no chills.  No chest pain.  Denies any BM in last 1 week.  Tells me that it is normal for her to go constipated.  No blood in the stool.  Passing gas.  Tolerated oral diet today.  Continues to smoke tells me that she smokes 1 pack a day over 3 days.  Denies any alcohol abuse.  Patient was hospitalized in June.  She has not followed up with a PCP.  She has not seen or performed a repeat evaluation on current splenic lesion seen on MRI.  She does not have a PCP and does not take any medication right now.  ED Course: On presentation was hypoxic 88% on room air.  Went on to require 4 LPM.  SARS Covid negative.  Patient was referred for admission.   Review of Systems: as mentioned in the history of present illness.  All other systems reviewed and are negative.  Past Medical History:  Diagnosis Date  . COPD (chronic obstructive pulmonary disease) (HCC)   . Diabetes mellitus without complication (HCC)    History reviewed. No pertinent surgical history. Social History:  reports that she has been smoking. She has never used smokeless tobacco. She reports previous drug use. She reports that she does not drink alcohol.  No Known Allergies  Family history reviewed and not pertinent Patient denies any exposure to  any respiratory illness in the family.   Prior to Admission medications   Medication Sig Start Date End Date Taking? Authorizing Provider  Aspirin-Salicylamide-Caffeine (BC HEADACHE PO) Take 1 packet by mouth daily as needed (headache).   Yes [provider]    Physical Exam: Vitals:   04/08/20 1830 04/08/20 1930 04/08/20 2117 04/08/20 2124  BP: (!) 174/97 (!) 167/91 (!) 182/96   Pulse: 91 78 77   Resp: 19 (!) 23 (!) 21   Temp:   98 F (36.7 C)   TempSrc:   Oral   SpO2: (!) 86% 93% 96%   Weight:    84.7 kg  Height:    5\' 2"  (1.575 m)    General: alert and oriented to time, place, and person. Appear in mild distress, affect appropriate Eyes: PERRL, Conjunctiva normal ENT: Oral Mucosa Clear, moist  Neck: no JVD, no Abnormal Mass Or lumps Cardiovascular: S1 and S2 Present, no Murmur, peripheral pulses symmetrical Respiratory: increased respiratory effort, Bilateral Air entry equal and Decreased, no signs of accessory muscle use, bilateral Crackles, bilateral expiratory  wheezes Abdomen: Bowel Sound present, Soft and no tenderness, no hernia Skin: no rashes  Extremities: trace Pedal edema, no calf tenderness Neurologic: without any new focal findings Gait not checked due to patient safety concerns  Data Reviewed: I have personally reviewed and interpreted labs, imaging as discussed below.  CBC: Recent Labs  Lab 04/08/20 1528  WBC 5.0  HGB 8.3*  HCT 28.0*  MCV 75.7*  PLT 144*   Basic Metabolic Panel: Recent Labs  Lab 04/08/20 1528  NA 140  K 3.3*  CL 103  CO2 29  GLUCOSE 109*  BUN 9  CREATININE 0.99  CALCIUM 9.0   GFR: Estimated Creatinine Clearance: 72.4 mL/min (by C-G formula based on SCr of 0.99 mg/dL). Liver Function Tests: Recent Labs  Lab 04/08/20 1528  AST 22  ALT 20  ALKPHOS 62  BILITOT 0.2*  PROT 7.1  ALBUMIN 3.6   Recent Labs  Lab 04/08/20 1528  LIPASE 32   No results for input(s): AMMONIA in the last 168 hours. Coagulation  Profile: No results for input(s): INR, PROTIME in the last 168 hours. Cardiac Enzymes: No results for input(s): CKTOTAL, CKMB, CKMBINDEX, TROPONINI in the last 168 hours. BNP (last 3 results) No results for input(s): PROBNP in the last 8760 hours. HbA1C: No results for input(s): HGBA1C in the last 72 hours. CBG: No results for input(s): GLUCAP in the last 168 hours. Lipid Profile: No results for input(s): CHOL, HDL, LDLCALC, TRIG, CHOLHDL, LDLDIRECT in the last 72 hours. Thyroid Function Tests: No results for input(s): TSH, T4TOTAL, FREET4, T3FREE, THYROIDAB in the last 72 hours. Anemia Panel: No results for input(s): VITAMINB12, FOLATE, FERRITIN, TIBC, IRON, RETICCTPCT in the last 72 hours. Urine analysis:    Component Value Date/Time   COLORURINE YELLOW 04/08/2020 1935   APPEARANCEUR CLEAR 04/08/2020 1935   LABSPEC >1.046 (H) 04/08/2020 1935   PHURINE 6.0 04/08/2020 1935   GLUCOSEU NEGATIVE 04/08/2020 1935   HGBUR NEGATIVE 04/08/2020 1935   BILIRUBINUR NEGATIVE 04/08/2020 1935   KETONESUR 5 (A) 04/08/2020 1935   PROTEINUR 30 (A) 04/08/2020 1935   NITRITE NEGATIVE 04/08/2020 1935   LEUKOCYTESUR NEGATIVE 04/08/2020 1935    Radiological Exams on Admission: CT ANGIO CHEST PE W OR WO CONTRAST  Result Date: 04/08/2020 CLINICAL DATA:  Shortness of breath nausea vomiting EXAM: CT ANGIOGRAPHY CHEST WITH CONTRAST TECHNIQUE: Multidetector CT imaging of the chest was performed using the standard protocol during bolus administration of intravenous contrast. Multiplanar CT image reconstructions and MIPs were obtained to evaluate the vascular anatomy. CONTRAST:  OMNIPAQUE IOHEXOL 350 MG/ML SOLN COMPARISON:  Chest x-ray 04/08/2020, CT chest 11/19/2017 FINDINGS: Cardiovascular: Satisfactory opacification of the pulmonary arteries to the segmental level. No evidence of pulmonary embolism. Mild cardiomegaly. No pericardial effusion. Mediastinum/Nodes: Midline trachea. No thyroid mass. No  suspicious adenopathy. Esophagus within normal limits Lungs/Pleura: No pleural effusion or pneumothorax. Mild bandlike densities in the bilateral lung bases likely due to scarring. Mild atelectasis or scar at the right middle lobe. Nodular ground-glass densities in the left lower lobe, for example series 3, image number 57. Upper Abdomen: No acute abnormality. Heterogenous hypoenhancement of the spleen with slightly rounded area posteriorly could be due to phase of enhancement. Musculoskeletal: No chest wall abnormality. No acute or significant osseous findings. Review of the MIP images confirms the above findings. IMPRESSION: 1. Negative for acute pulmonary embolus. 2. Nodular ground-glass densities in the left lower lobe, suspect that this is related to respiratory tract infection. These measure up to 7 mm in size. Initial follow-up with CT at 6-12 months is recommended to confirm persistence. If persistent, repeat CT is recommended every 2 years until 5 years of stability has been established. This recommendation follows the consensus statement: Guidelines for Management of Incidental Pulmonary Nodules Detected on CT Images: From the Fleischner Society 2017; Radiology 2017; 284:228-243. 3. Mild cardiomegaly.  4. Heterogenous enhancement of the spleen with questionable rounded area of hypoenhancement posteriorly at the site of previously questioned abnormality, see MRI 09/20/2019. Electronically Signed   By: Donavan Foil M.D.   On: 04/08/2020 20:45   CT ABDOMEN PELVIS W CONTRAST  Result Date: 04/08/2020 CLINICAL DATA:  Epigastric pain with nausea and vomiting EXAM: CT ABDOMEN AND PELVIS WITH CONTRAST TECHNIQUE: Multidetector CT imaging of the abdomen and pelvis was performed using the standard protocol following bolus administration of intravenous contrast. CONTRAST:  145mL OMNIPAQUE IOHEXOL 350 MG/ML SOLN COMPARISON:  MRI 09/20/2019, CT 09/19/2019 FINDINGS: Lower chest: Lung bases demonstrate scattered foci of  mild linear and bandlike density at the bases possible post infectious scarring. Minimal atelectasis or scar at the right middle lobe. Mild cardiomegaly. Hepatobiliary: No focal hepatic abnormality. Multiple calcified gallstones. No biliary dilatation Pancreas: Unremarkable. No pancreatic ductal dilatation or surrounding inflammatory changes. Spleen: Borderline enlarged at 13.1 cm. Previously noted hypodense lesion in the posterior spleen is not clearly identified today Adrenals/Urinary Tract: Adrenal glands are unremarkable. Kidneys are normal, without renal calculi, focal lesion, or hydronephrosis. Bladder is unremarkable. Stomach/Bowel: Small amount of radiopaque material in the stomach. No dilated small bowel. No acute bowel wall thickening. Negative appendix Vascular/Lymphatic: No significant vascular findings are present. No enlarged abdominal or pelvic lymph nodes. Reproductive: Rim calcified fibroid within the uterus. No adnexal mass. Other: Negative for free air or free fluid Musculoskeletal: No acute or significant osseous findings. IMPRESSION: 1. No CT evidence for acute intra-abdominal or pelvic abnormality. 2. Gallstones. 3. Borderline splenomegaly Electronically Signed   By: Donavan Foil M.D.   On: 04/08/2020 20:35   DG Chest Portable 1 View  Result Date: 04/08/2020 CLINICAL DATA:  Shortness of breath EXAM: PORTABLE CHEST 1 VIEW COMPARISON:  None. FINDINGS: Cardiomegaly. No confluent opacities or effusions. No acute bony abnormality. IMPRESSION: Cardiomegaly.  No active disease. Electronically Signed   By: Rolm Baptise M.D.   On: 04/08/2020 16:41   I reviewed all nursing notes, pharmacy notes, vitals, pertinent old records.  Assessment/Plan 1.  Acute COPD exacerbation right lower lobe community-acquired pneumonia Acute hypoxic respiratory failure, POA Presents with cough and shortness of breath. Bilateral expiratory wheezing and left-sided crackles. Hypoxic to the point of 88% on room air  on admission currently requiring 4 LPM. On exertion dropped down to 83%. CT scan shows evidence of left lower lobe pneumonia. Treated with IV Solu-Medrol, duo nebs, doxycycline. Did not receive flu vaccine.  Only received 1 dose of either vaccine which led to some arm pain therefore she did not receive the second vaccine. Currently willing to take a vaccine when appropriate. CT scan negative for PE. Recommendation is for a repeat CT scan in 6 to 12 months to ensure resolution of nodular lesions. Follow-up on cultures.  2.  Active smoker Counseled to quit smoking. Nicotine patch.  3.  Abdominal pain with nausea and vomiting. Etiology not clear.  Currently resolved. Like this was with her constipation. CT scan of the abdomen negative for any acute abnormality. Does show evidence of splenic lesion still present. Recommend outpatient work-up  4.  Chronic microcytic anemia. Etiology not clear pretension will perform further work-up.  5.  Obesity. Check TSH. Check hemoglobin A1c. Placing the patient at risk for poor outcome.  6.  Essential hypertension Not on any medication right now comfortable monitor for now  Nutrition: Cardiac diet DVT Prophylaxis: Subcutaneous Lovenox  Advance goals of care discussion: Full code   Consults: none   Family  Communication: no family was present at bedside, at the time of interview.   Disposition:  From: Home Likely will need Home on discharge.   Author: Lynden Oxford, MD [facility] 04/08/2020 11:59 PM   To reach On-call, see care teams to locate the attending and reach out to them via [email or web address]. If 7PM-7AM, please contact night-coverage If you still have difficulty reaching the attending provider, please page the [facility] (Director on Call) for [facility] on amion for assistance.

## 2021-04-23 ENCOUNTER — Other Ambulatory Visit: Payer: Self-pay

## 2021-04-23 ENCOUNTER — Emergency Department (HOSPITAL_BASED_OUTPATIENT_CLINIC_OR_DEPARTMENT_OTHER)
Admission: EM | Admit: 2021-04-23 | Discharge: 2021-04-23 | Disposition: A | Discharge status: Home / Self Care | Payer: Self-pay | Attending: Emergency Medicine | Admitting: Emergency Medicine

## 2021-04-23 ENCOUNTER — Encounter (HOSPITAL_BASED_OUTPATIENT_CLINIC_OR_DEPARTMENT_OTHER): Payer: Self-pay

## 2021-04-23 DIAGNOSIS — J449 Chronic obstructive pulmonary disease, unspecified: Secondary | ICD-10-CM | POA: Insufficient documentation

## 2021-04-23 DIAGNOSIS — I10 Essential (primary) hypertension: Secondary | ICD-10-CM | POA: Insufficient documentation

## 2021-04-23 DIAGNOSIS — F17200 Nicotine dependence, unspecified, uncomplicated: Secondary | ICD-10-CM | POA: Insufficient documentation

## 2021-04-23 DIAGNOSIS — K219 Gastro-esophageal reflux disease without esophagitis: Secondary | ICD-10-CM | POA: Insufficient documentation

## 2021-04-23 DIAGNOSIS — R1013 Epigastric pain: Secondary | ICD-10-CM

## 2021-04-23 LAB — CBC WITH DIFFERENTIAL/PLATELET
Abs Immature Granulocytes: 0.01 10*3/uL (ref 0.00–0.07)
Basophils Absolute: 0 10*3/uL (ref 0.0–0.1)
Basophils Relative: 0 %
Eosinophils Absolute: 0.1 10*3/uL (ref 0.0–0.5)
Eosinophils Relative: 1 %
HCT: 27.4 % — ABNORMAL LOW (ref 36.0–46.0)
Hemoglobin: 7.7 g/dL — ABNORMAL LOW (ref 12.0–15.0)
Immature Granulocytes: 0 %
Lymphocytes Relative: 25 %
Lymphs Abs: 1.4 10*3/uL (ref 0.7–4.0)
MCH: 19.6 pg — ABNORMAL LOW (ref 26.0–34.0)
MCHC: 28.1 g/dL — ABNORMAL LOW (ref 30.0–36.0)
MCV: 69.9 fL — ABNORMAL LOW (ref 80.0–100.0)
Monocytes Absolute: 0.5 10*3/uL (ref 0.1–1.0)
Monocytes Relative: 8 %
Neutro Abs: 3.6 10*3/uL (ref 1.7–7.7)
Neutrophils Relative %: 66 %
Platelets: 145 10*3/uL — ABNORMAL LOW (ref 150–400)
RBC: 3.92 MIL/uL (ref 3.87–5.11)
RDW: 21.5 % — ABNORMAL HIGH (ref 11.5–15.5)
WBC: 5.5 10*3/uL (ref 4.0–10.5)
nRBC: 0 % (ref 0.0–0.2)

## 2021-04-23 LAB — LIPASE, BLOOD: Lipase: 37 U/L (ref 11–51)

## 2021-04-23 LAB — OCCULT BLOOD X 1 CARD TO LAB, STOOL: Fecal Occult Bld: NEGATIVE

## 2021-04-23 LAB — COMPREHENSIVE METABOLIC PANEL
ALT: 17 U/L (ref 0–44)
AST: 18 U/L (ref 15–41)
Albumin: 3.5 g/dL (ref 3.5–5.0)
Alkaline Phosphatase: 63 U/L (ref 38–126)
Anion gap: 6 (ref 5–15)
BUN: 8 mg/dL (ref 6–20)
CO2: 25 mmol/L (ref 22–32)
Calcium: 9 mg/dL (ref 8.9–10.3)
Chloride: 105 mmol/L (ref 98–111)
Creatinine, Ser: 0.72 mg/dL (ref 0.44–1.00)
GFR, Estimated: >60 mL/min (ref >60)
Glucose, Bld: 82 mg/dL (ref 70–99)
Potassium: 3.7 mmol/L (ref 3.5–5.1)
Sodium: 136 mmol/L (ref 135–145)
Total Bilirubin: 0.3 mg/dL (ref 0.3–1.2)
Total Protein: 6.9 g/dL (ref 6.5–8.1)

## 2021-04-23 LAB — URINALYSIS, ROUTINE W REFLEX MICROSCOPIC
Bilirubin Urine: NEGATIVE
Glucose, UA: NEGATIVE mg/dL
Hgb urine dipstick: NEGATIVE
Ketones, ur: NEGATIVE mg/dL
Nitrite: NEGATIVE
Protein, ur: NEGATIVE mg/dL
Specific Gravity, Urine: 1.025 (ref 1.005–1.030)
pH: 6.5 (ref 5.0–8.0)

## 2021-04-23 LAB — URINALYSIS, MICROSCOPIC (REFLEX)

## 2021-04-23 LAB — PREGNANCY, URINE: Preg Test, Ur: NEGATIVE

## 2021-04-23 MED ORDER — SUCRALFATE 1 G PO TABS: 1.0000 g | ORAL_TABLET | Freq: Three times a day (TID) | ORAL | 0 refills | Status: AC

## 2021-04-23 MED ORDER — MORPHINE SULFATE (PF) 2 MG/ML IV SOLN
2.0000 mg | Freq: Once | INTRAVENOUS | Status: AC
  Administered 2021-04-23: 2 mg via INTRAVENOUS
  Filled 2021-04-23: qty 1

## 2021-04-23 MED ORDER — ALUM & MAG HYDROXIDE-SIMETH 200-200-20 MG/5ML PO SUSP
30.0000 mL | Freq: Once | ORAL | Status: AC
  Administered 2021-04-23: 30 mL via ORAL
  Filled 2021-04-23: qty 30

## 2021-04-23 MED ORDER — FAMOTIDINE 20 MG PO TABS: 20.0000 mg | ORAL_TABLET | Freq: Two times a day (BID) | ORAL | 0 refills | Status: AC

## 2021-04-23 MED ORDER — PANTOPRAZOLE SODIUM 40 MG IV SOLR
40.0000 mg | Freq: Once | INTRAVENOUS | Status: AC
  Administered 2021-04-23: 40 mg via INTRAVENOUS
  Filled 2021-04-23: qty 40

## 2021-04-23 MED ORDER — LIDOCAINE VISCOUS HCL 2 % MT SOLN
15.0000 mL | Freq: Once | OROMUCOSAL | Status: AC
Start: 2021-04-23 — End: 2021-04-23
  Administered 2021-04-23: 15 mL via ORAL
  Filled 2021-04-23: qty 15

## 2021-04-23 NOTE — Discharge Instructions (Addendum)
As we discussed your presentation today is consistent with acid reflux probably with gastric ulcers.  I recommend that you discontinue the [facility] powder, you can use Tylenol for pain as needed.  In addition I recommend that you start taking the Pepcid that I prescribed twice daily, as well as you can use the Carafate with meals and at bedtime to help coat the lining of your stomach.  You can also use over-the-counter Tums for breakthrough acid reflux pain.  I have attached some documents that tell you more about foods and lifestyle choices that may trigger your acid reflux, please read over these documents for additional information.  Please follow-up with a GI doctor at your earliest convenience for further evaluation, so someone can perform a scope of your esophagus, possible take a biopsy and help to alleviate your symptoms.  Additionally you have had elevated blood pressure while you have been here, have had a history of elevated blood pressure in the past according to your chart.  I recommend that you follow-up with your primary care doctor to further control your blood pressure.

## 2021-04-23 NOTE — ED Provider Notes (Signed)
[facility] EMERGENCY DEPARTMENT Provider Note   CSN: [id number] Arrival date & time: 04/23/21  1608     History  Chief Complaint  Patient presents with   Abdominal Pain    Grace Collier is a 46 y.o. female with a history of upper abdominal pain, COPD, hypertension, tobacco abuse who presents with upper and left-sided abdominal pain for the last week.  Patient also endorses some nausea and vomiting.  Patient reports that pain is in the upper abdomen, associated with a burning sensation, worse with lying down, worse right after meals.  Patient reports that she has been taking 4-5 BC powders per day which helped some with the pain.  Patient does report that she has had some darkening of her stools.    Abdominal Pain Associated symptoms: nausea and vomiting       Home Medications Prior to Admission medications   Medication Sig Start Date End Date Taking? Authorizing Provider  famotidine (PEPCID) 20 MG tablet Take 1 tablet (20 mg total) by mouth 2 (two) times daily. 04/23/21  Yes Shailee Foots H, PA-C  sucralfate (CARAFATE) 1 g tablet Take 1 tablet (1 g total) by mouth 4 (four) times daily -  with meals and at bedtime. 04/23/21  Yes Charolett Yarrow H, PA-C  albuterol (VENTOLIN HFA) 108 (90 Base) MCG/ACT inhaler Inhale 2 puffs into the lungs every 6 (six) hours as needed for wheezing or shortness of breath. 04/09/20   Pokhrel, Rebekah ChesterfieldLaxman, MD  Aspirin-Salicylamide-Caffeine (BC HEADACHE PO) Take 1 packet by mouth daily as needed (headache).    [provider]  doxycycline (VIBRA-TABS) 100 MG tablet Take 1 tablet (100 mg total) by mouth every 12 (twelve) hours. 04/09/20   Pokhrel, Rebekah ChesterfieldLaxman, MD  hydrochlorothiazide (MICROZIDE) 12.5 MG capsule Take 1 capsule (12.5 mg total) by mouth daily. 04/09/20 04/09/21  Pokhrel, Rebekah ChesterfieldLaxman, MD  nicotine (NICODERM CQ - DOSED IN MG/24 HOURS) 14 mg/24hr patch Place 1 patch (14 mg total) onto the skin daily. 04/10/20   Pokhrel, Rebekah ChesterfieldLaxman, MD   pantoprazole (PROTONIX) 40 MG tablet Take 1 tablet (40 mg total) by mouth daily before breakfast. 04/09/20   Pokhrel, Rebekah ChesterfieldLaxman, MD  polyethylene glycol (MIRALAX / GLYCOLAX) 17 g packet Take 17 g by mouth daily as needed for moderate constipation or severe constipation. 04/09/20   Pokhrel, Rebekah ChesterfieldLaxman, MD      Allergies    Patient has no known allergies.    Review of Systems   Review of Systems  Gastrointestinal:  Positive for abdominal pain, nausea and vomiting.  All other systems reviewed and are negative.  Physical Exam Updated Vital Signs BP (!) 148/88    Pulse 81    Temp 98.6 F (37 C) (Oral)    Resp (!) 22    Ht 4\' 11"  (1.499 m)    Wt 82.1 kg    SpO2 99%    BMI 36.56 kg/m  Physical Exam Vitals and nursing note reviewed.  Constitutional:      General: She is not in acute distress.    Appearance: Normal appearance.  HENT:     Head: Normocephalic and atraumatic.  Eyes:     General:        Right eye: No discharge.        Left eye: No discharge.  Cardiovascular:     Rate and Rhythm: Normal rate and regular rhythm.     Heart sounds: No murmur heard.   No friction rub. No gallop.  Pulmonary:     Effort:  Pulmonary effort is normal.     Breath sounds: Normal breath sounds.  Abdominal:     General: Bowel sounds are normal.     Palpations: Abdomen is soft.     Comments: Significant tenderness to palpation epigastric region, no rebound, rigidity, guarding.  Normal bowel sounds throughout.  No right upper quadrant pain, Murphy sign negative.  Skin:    General: Skin is warm and dry.     Capillary Refill: Capillary refill takes less than 2 seconds.  Neurological:     Mental Status: She is alert and oriented to person, place, and time.  Psychiatric:        Mood and Affect: Mood normal.        Behavior: Behavior normal.    ED Results / Procedures / Treatments   Labs (all labs ordered are listed, but only abnormal results are displayed) Labs Reviewed  CBC WITH DIFFERENTIAL/PLATELET -  Abnormal; Notable for the following components:      Result Value   Hemoglobin 7.7 (*)    HCT 27.4 (*)    MCV 69.9 (*)    MCH 19.6 (*)    MCHC 28.1 (*)    RDW 21.5 (*)    Platelets 145 (*)    All other components within normal limits  URINALYSIS, ROUTINE W REFLEX MICROSCOPIC - Abnormal; Notable for the following components:   APPearance HAZY (*)    Leukocytes,Ua TRACE (*)    All other components within normal limits  URINALYSIS, MICROSCOPIC (REFLEX) - Abnormal; Notable for the following components:   Bacteria, UA MANY (*)    All other components within normal limits  COMPREHENSIVE METABOLIC PANEL  LIPASE, BLOOD  PREGNANCY, URINE  OCCULT BLOOD X 1 CARD TO LAB, STOOL    EKG None  Radiology No results found.  Procedures Procedures    Medications Ordered in ED Medications  morphine 2 MG/ML injection 2 mg (2 mg Intravenous Given 04/23/21 1714)  alum & mag hydroxide-simeth (MAALOX/MYLANTA) 200-200-20 MG/5ML suspension 30 mL (30 mLs Oral Given 04/23/21 1715)    And  lidocaine (XYLOCAINE) 2 % viscous mouth solution 15 mL (15 mLs Oral Given 04/23/21 1715)  pantoprazole (PROTONIX) injection 40 mg (40 mg Intravenous Given 04/23/21 1715)    ED Course/ Medical Decision Making/ A&P                           Medical Decision Making  This is a patient with a history of upper abdominal pain, hypertension who presents with upper abdominal pain nausea, vomiting for 1 week.  Patient is worse at night, with laying down, and directly after eating.  Differential diagnosis includes cholecystitis, cholelithiasis, choledocholithiasis, gastritis, gastroenteritis, gastric ulcers, duodenal ulcers, pancreatitis versus other.  This is not an exhaustive differential.  My physical exam is significant for no right upper quadrant pain, patient with significant epigastric pain.  Does report some radiation to the chest.  Will obtain a screening EKG.  Screening EKG is normal, was independently reviewed by my  attending Dr. Stevie Kern who agrees with nonischemic picture.    I personally ordered and reviewed lab work which is significant for hemoglobin of 7.7, with decreased MCV of 69.9, this is consistent with an iron deficiency anemia versus other.  Encourage increase iron intake with patient.  Her urinalysis is some trace leukocytes, and bacteria however she has no symptoms of dysuria, I do not believe this represents an acute urinary tract infection.  Lipase is negative,  CMP is unremarkable.  Hemoccult is negative.  Given patient presentation, no significant lab abnormalities other than her chronic microcytic anemia I believe that her presentation is most consistent with severe acid reflux versus gastric ulcer.  Patient does report improved pain after GI cocktail, Protonix, morphine.  Encouraged close follow-up with GI for possible endoscopy.  We will discharge with Pepcid, and Carafate.  Patient discharged in stable condition, return precautions given. Final Clinical Impression(s) / ED Diagnoses Final diagnoses:  Dyspepsia  Gastroesophageal reflux disease, unspecified whether esophagitis present    Rx / DC Orders ED Discharge Orders          Ordered    famotidine (PEPCID) 20 MG tablet  2 times daily        04/23/21 1820    sucralfate (CARAFATE) 1 g tablet  3 times daily with meals & bedtime        04/23/21 1820              Olene Floss, PA-C 04/23/21 1831    Milagros Loll, MD 04/23/21 2144

## 2021-04-23 NOTE — ED Triage Notes (Signed)
Pt c/o left side abd pain, n/v x 1 week-NAD-steady gait

## 2022-02-08 IMAGING — CT CT ABD-PELV W/ CM
2 of 7 series · 16 of 46 positions shown, 18 images · IV contrast (Omnipaque)
Comparison: MRI 09/20/2019, CT 09/19/2019

CLINICAL DATA: Epigastric pain with nausea and vomiting

EXAM:
CT ABDOMEN AND PELVIS WITH CONTRAST
TECHNIQUE: Multidetector CT imaging of the abdomen and pelvis was performed
using the standard protocol following bolus administration of
intravenous contrast.
CONTRAST:  100mL OMNIPAQUE IOHEXOL 350 MG/ML SOLN

[Series 4: axial st · axial · 0.80mm/px · z∈[-400,-26]mm · 13 of 87 slices shown, 15 images]
[im 6/87  soft-tissue]
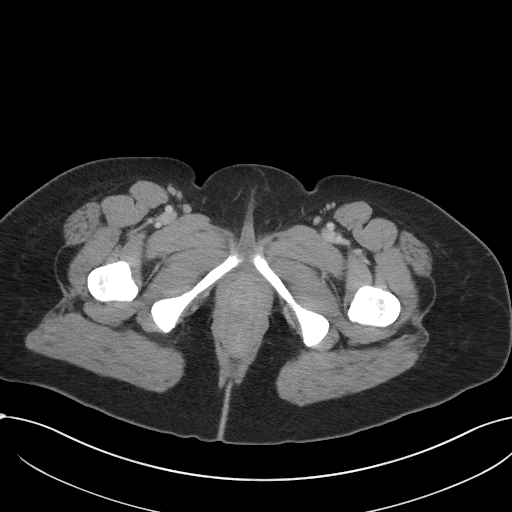
[im 6/87  bone]
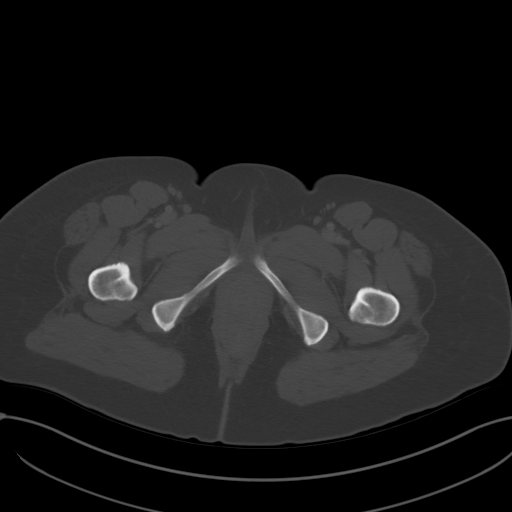
[im 11/87  soft-tissue]
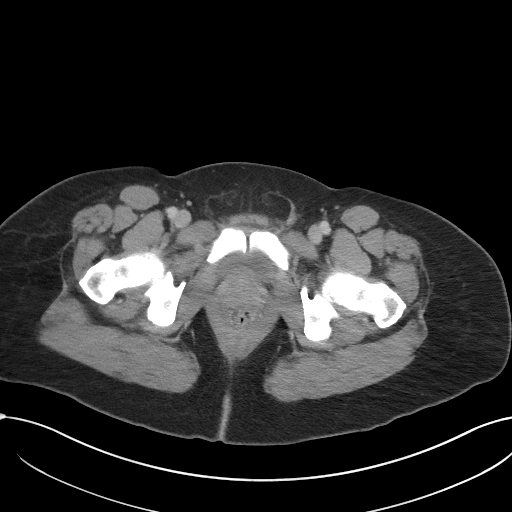
[im 21/87  soft-tissue]
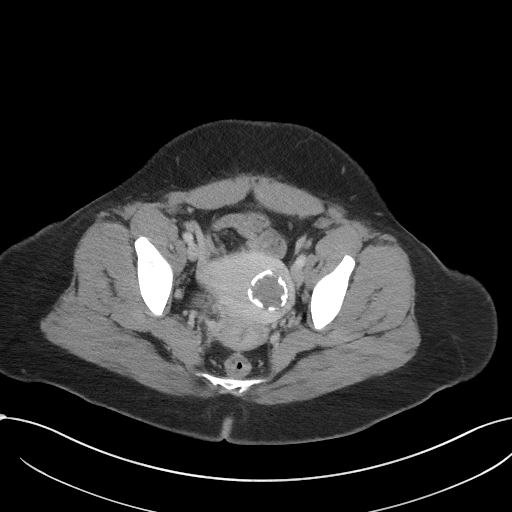
[im 26/87  soft-tissue]
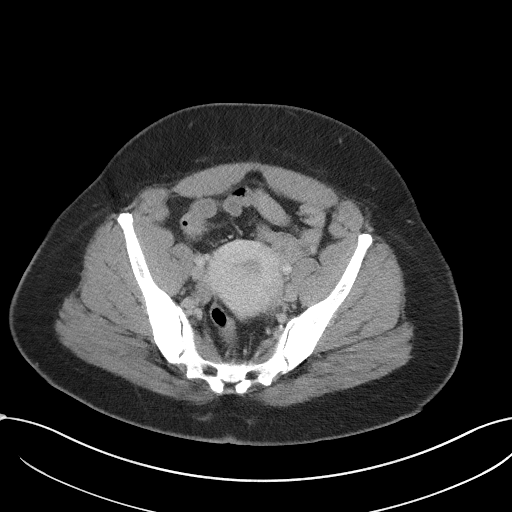
[im 31/87  soft-tissue]
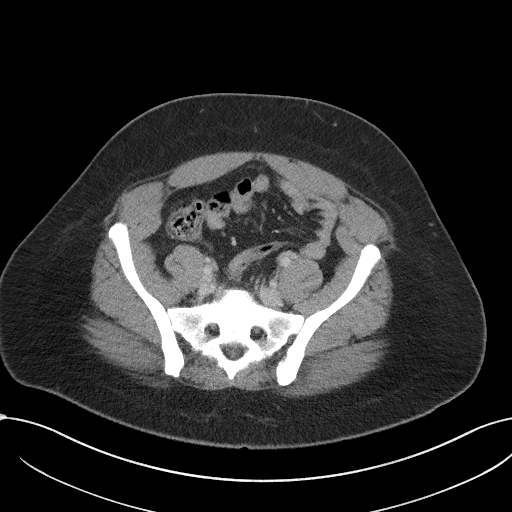
[im 36/87  soft-tissue]
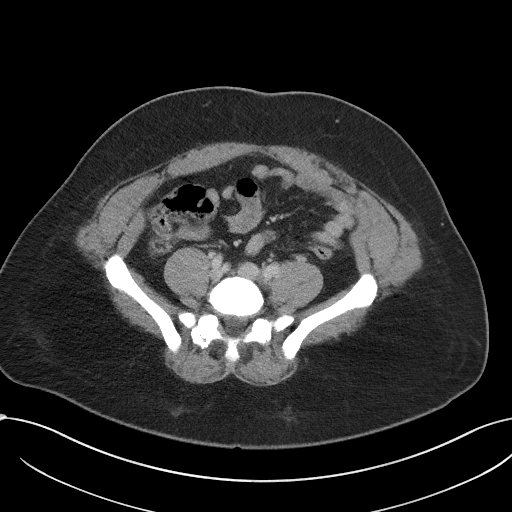
[im 46/87  soft-tissue]
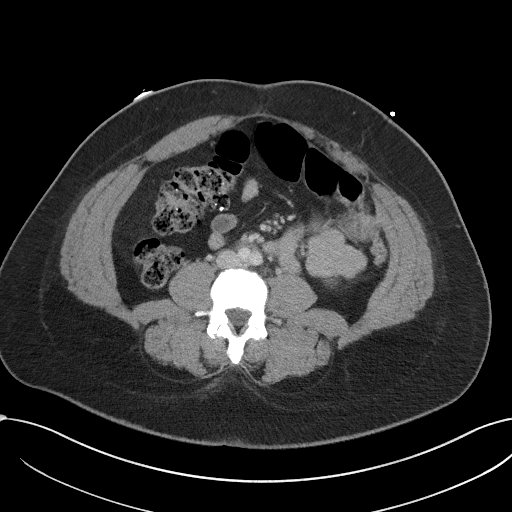
[im 51/87  soft-tissue]
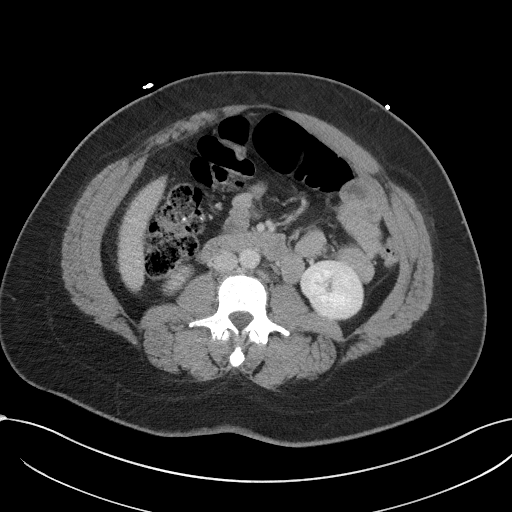
[im 56/87  soft-tissue]
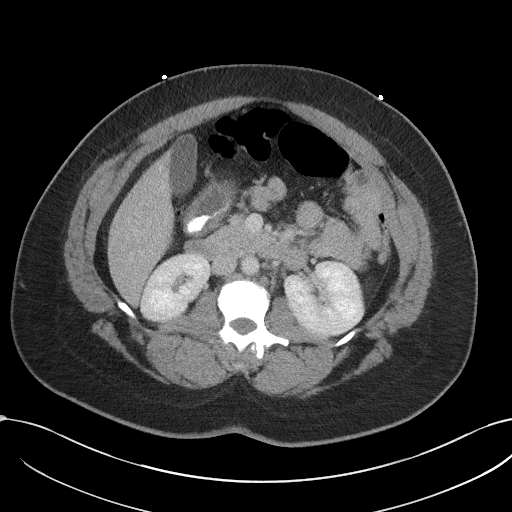
[im 56/87  bone]
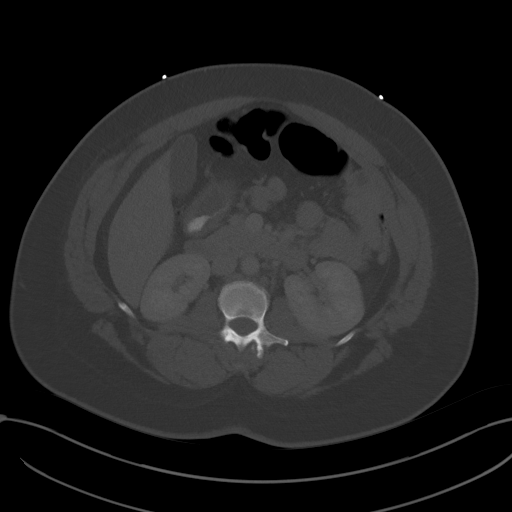
[im 61/87  soft-tissue]
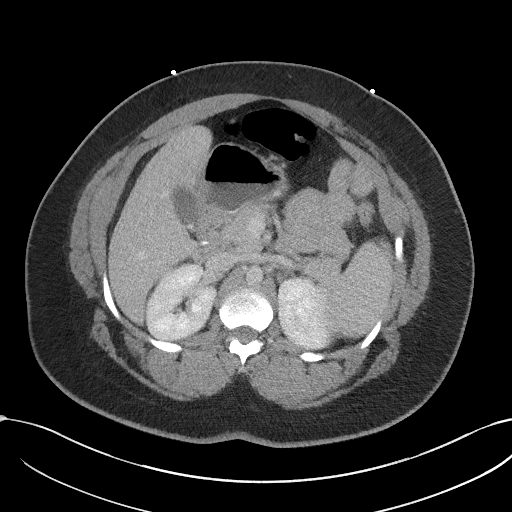
[im 66/87  soft-tissue]
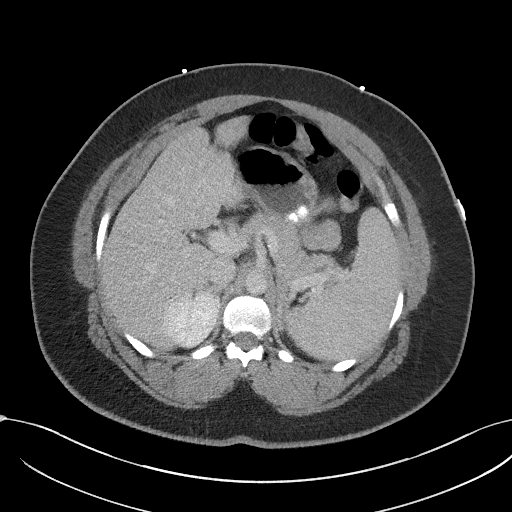
[im 76/87  soft-tissue]
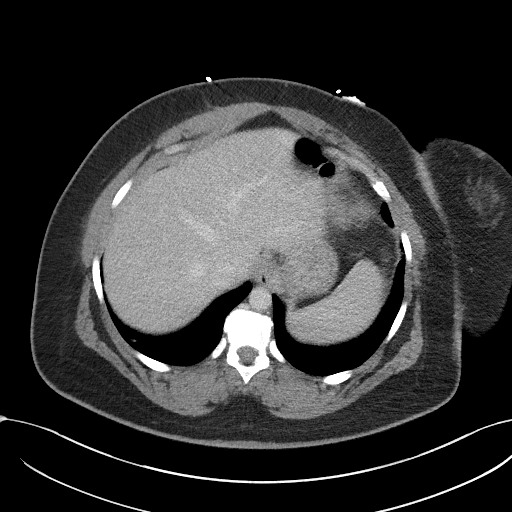
[im 81/87  soft-tissue]
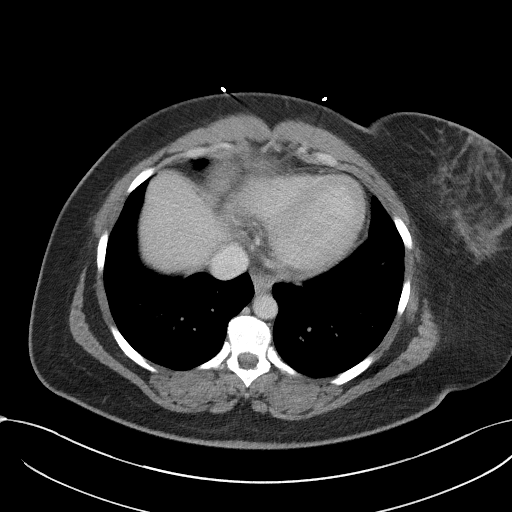

[Series 7: coronal st · coronal · 0.76mm/px · 3 of 104 slices shown]
[im 35/104  soft-tissue]
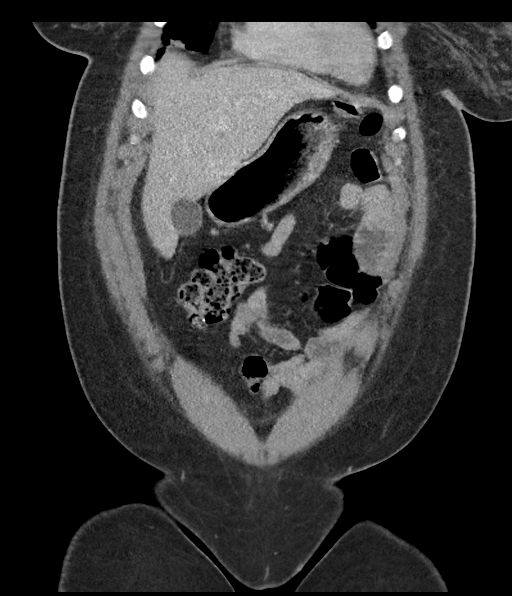
[im 46/104  soft-tissue]
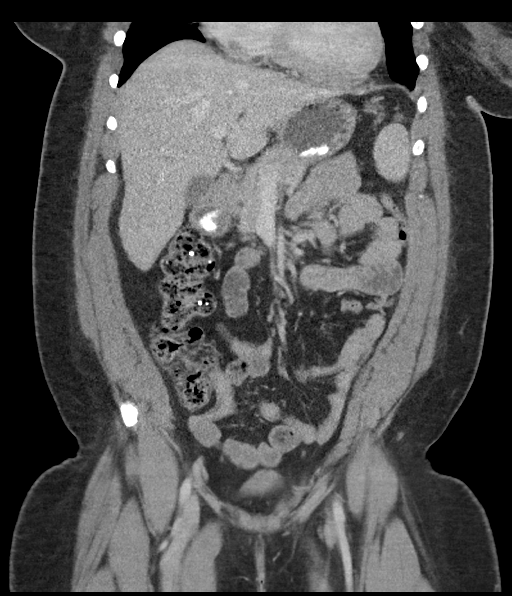
[im 58/104  soft-tissue]
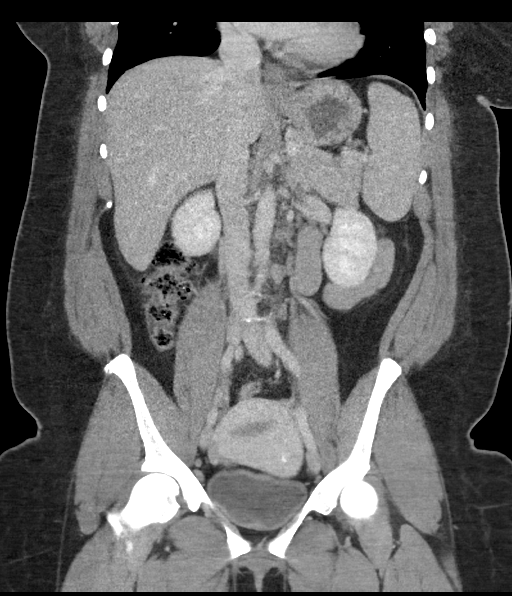

[16 of 46 positions shown; findings below may reference images not displayed]

FINDINGS: Lower chest: Lung bases demonstrate scattered foci of mild linear
and bandlike density at the bases possible post infectious scarring.
Minimal atelectasis or scar at the right middle lobe. Mild
cardiomegaly.

Hepatobiliary: No focal hepatic abnormality. Multiple calcified
gallstones. No biliary dilatation

Pancreas: Unremarkable. No pancreatic ductal dilatation or
surrounding inflammatory changes.

Spleen: Borderline enlarged at 13.1 cm. Previously noted hypodense
lesion in the posterior spleen is not clearly identified today

Adrenals/Urinary Tract: Adrenal glands are unremarkable. Kidneys are
normal, without renal calculi, focal lesion, or hydronephrosis.
Bladder is unremarkable.

Stomach/Bowel: Small amount of radiopaque material in the stomach.
No dilated small bowel. No acute bowel wall thickening. Negative
appendix

Vascular/Lymphatic: No significant vascular findings are present. No
enlarged abdominal or pelvic lymph nodes.

Reproductive: Rim calcified fibroid within the uterus. No adnexal
mass.

Other: Negative for free air or free fluid

Musculoskeletal: No acute or significant osseous findings.
IMPRESSION: 1. No CT evidence for acute intra-abdominal or pelvic abnormality.
2. Gallstones.
3. Borderline splenomegaly

## 2022-02-08 IMAGING — CT CT ANGIO CHEST
2 of 6 series · 18 of 36 positions shown · IV contrast (Omnipaque)
Comparison: Chest x-ray 04/08/2020, CT chest 11/19/2017

CLINICAL DATA: Shortness of breath nausea vomiting

EXAM:
CT ANGIOGRAPHY CHEST WITH CONTRAST
TECHNIQUE: Multidetector CT imaging of the chest was performed using the
standard protocol during bolus administration of intravenous
contrast. Multiplanar CT image reconstructions and MIPs were
obtained to evaluate the vascular anatomy.
CONTRAST:  100mL OMNIPAQUE IOHEXOL 350 MG/ML SOLN

[Series 4: pe coronal mpr · coronal · 0.49mm/px · 1 of 101 slices shown]
[im 51/101  mediastinal]
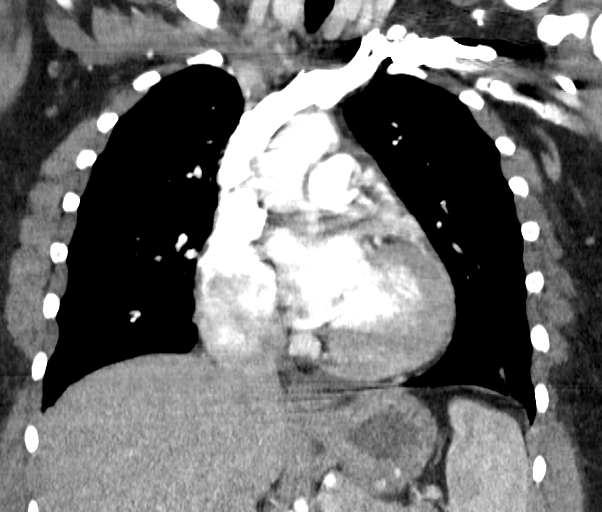

[Series 8: pe thins · axial · 0.65mm/px · z∈[-88,+147]mm · 17 of 348 slices shown]
[im 17/348  lung]
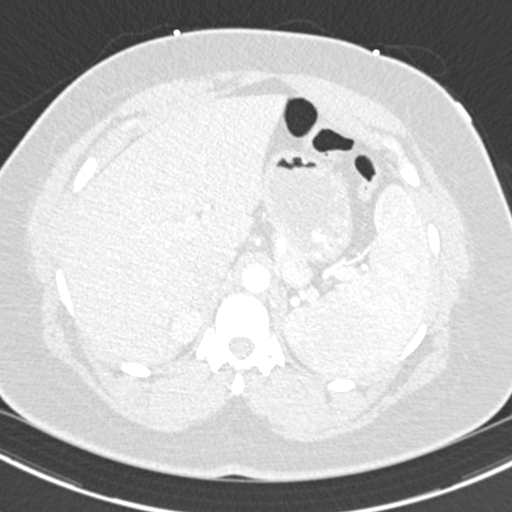
[im 34/348  mediastinal]
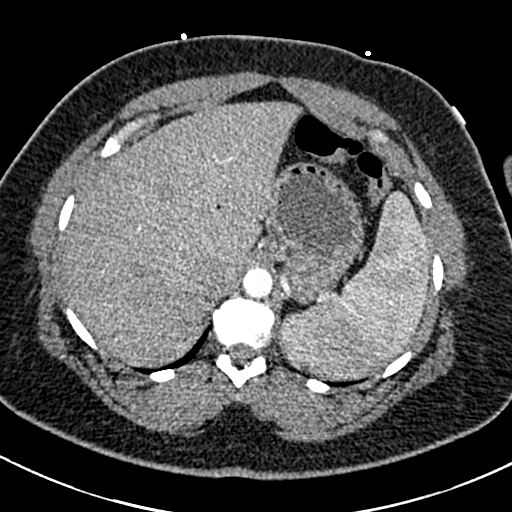
[im 50/348  lung]
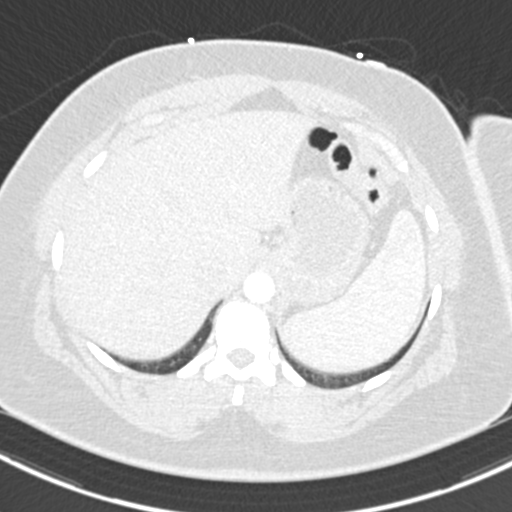
[im 83/348  mediastinal]
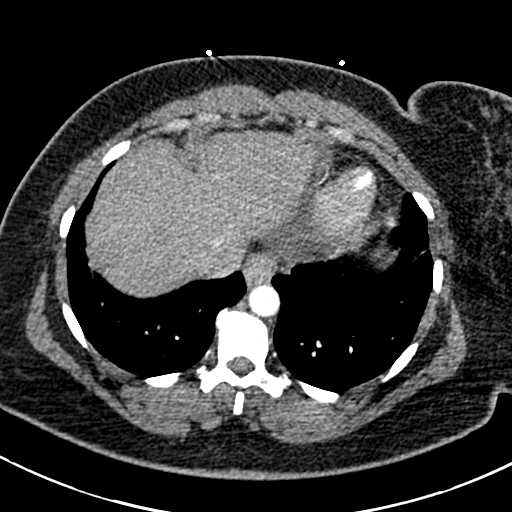
[im 100/348  lung]
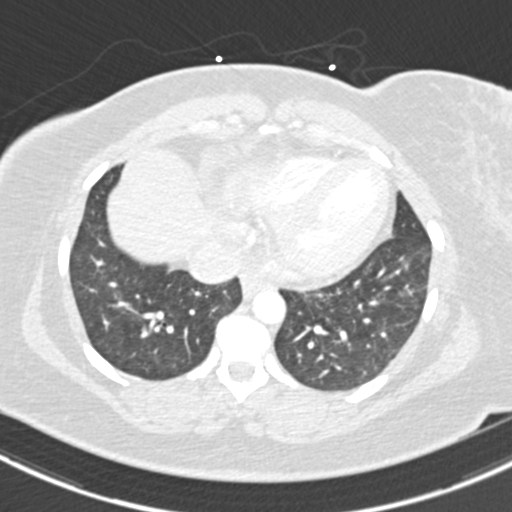
[im 116/348  mediastinal]
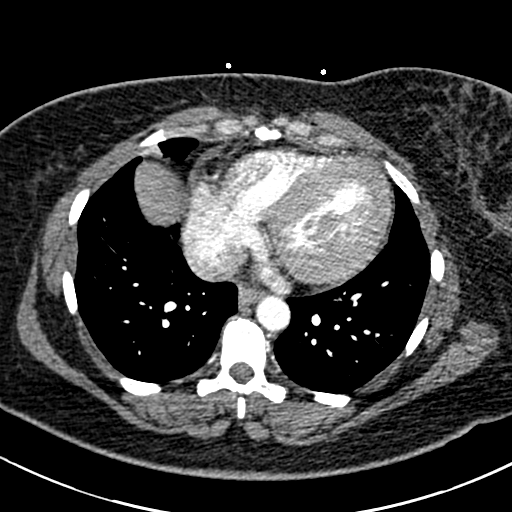
[im 133/348  lung]
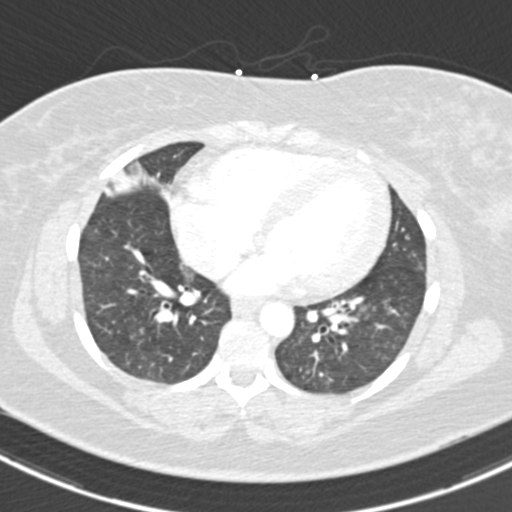
[im 149/348  mediastinal]
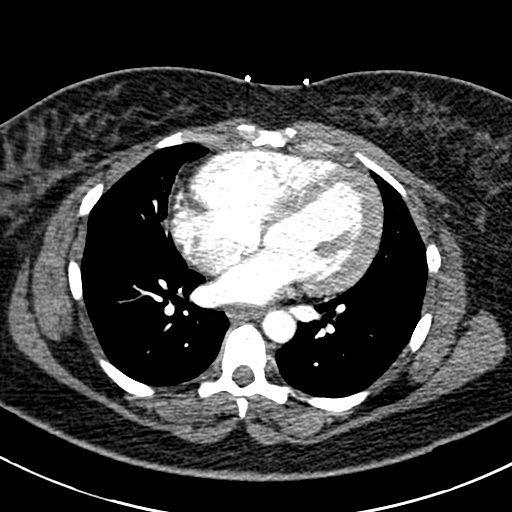
[im 182/348  lung]
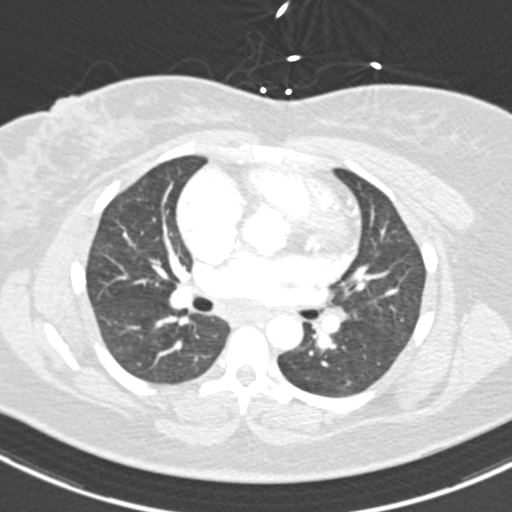
[im 199/348  mediastinal]
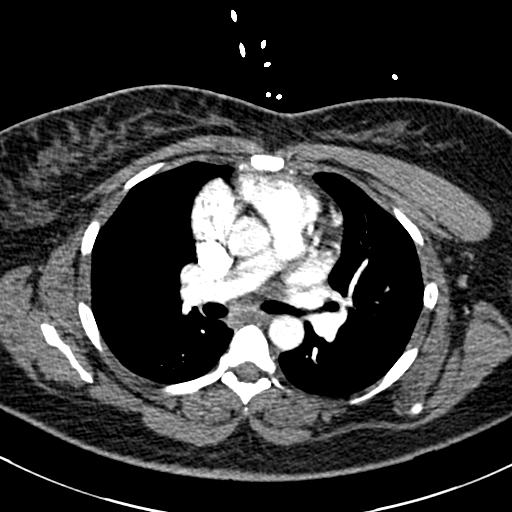
[im 215/348  lung]
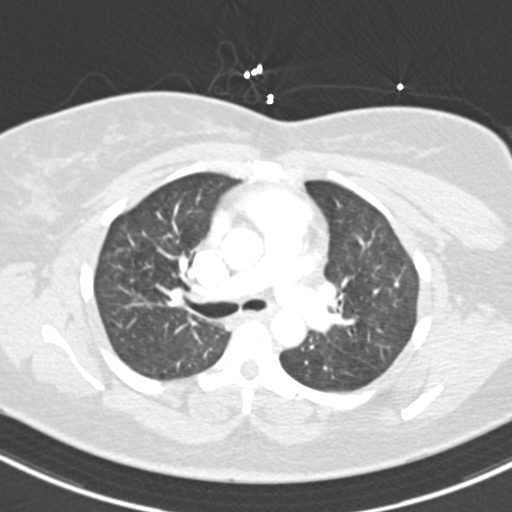
[im 232/348  mediastinal]
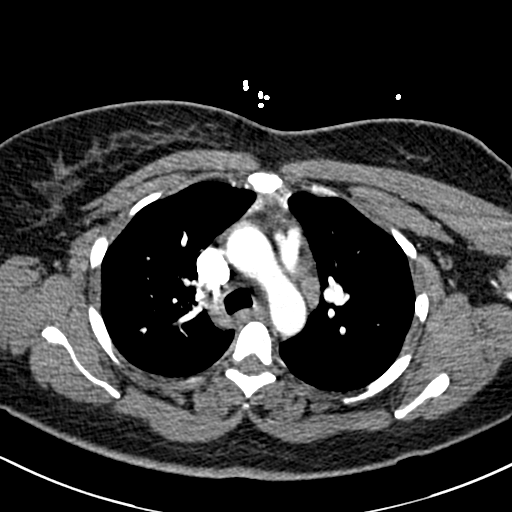
[im 248/348  lung]
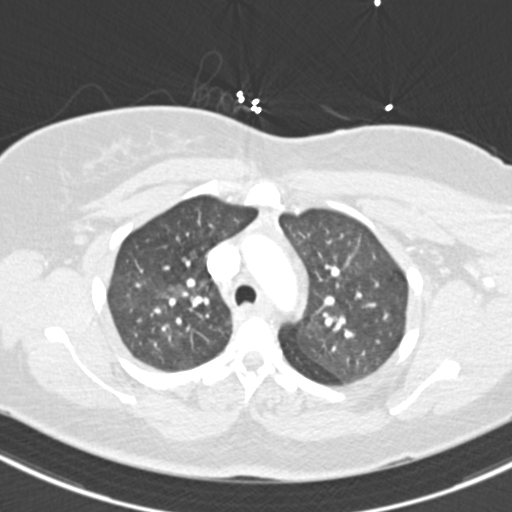
[im 265/348  mediastinal]
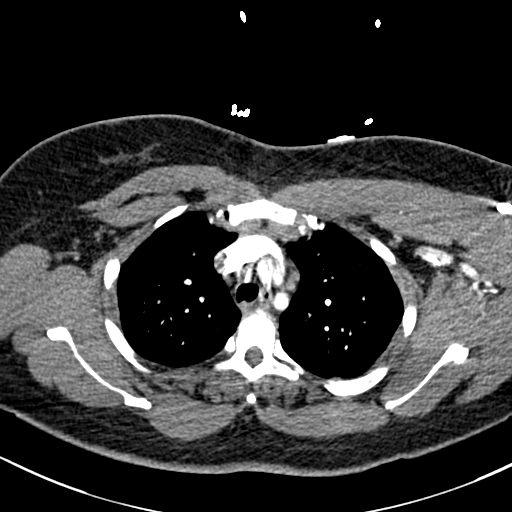
[im 298/348  lung]
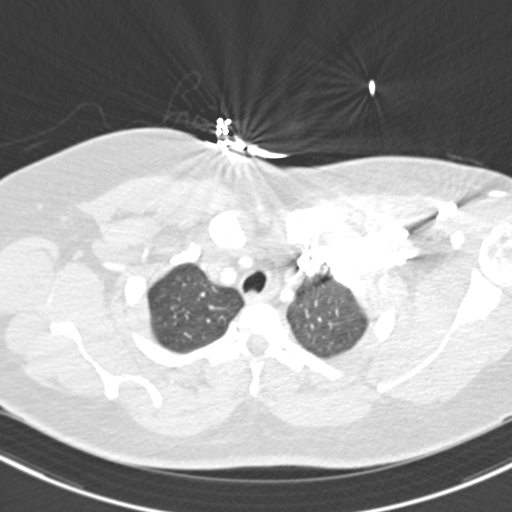
[im 314/348  mediastinal]
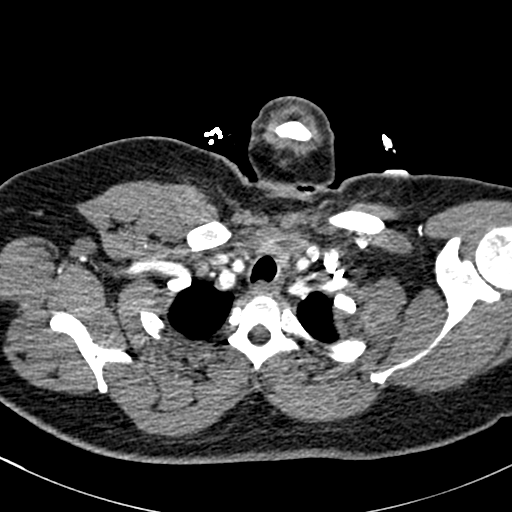
[im 331/348  lung]
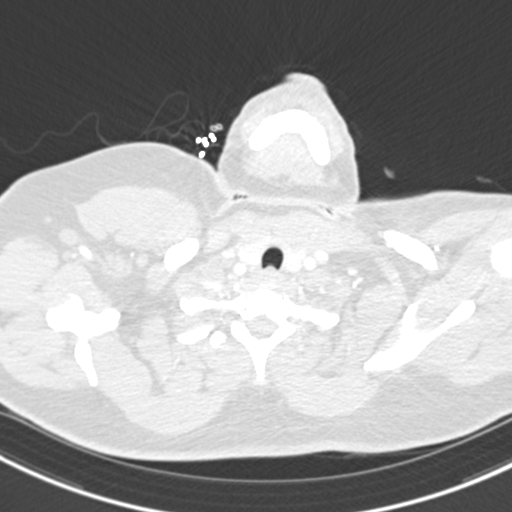

[18 of 36 positions shown; findings below may reference images not displayed]

FINDINGS: Cardiovascular: Satisfactory opacification of the pulmonary arteries
to the segmental level. No evidence of pulmonary embolism. Mild
cardiomegaly. No pericardial effusion.

Mediastinum/Nodes: Midline trachea. No thyroid mass. No suspicious
adenopathy. Esophagus within normal limits

Lungs/Pleura: No pleural effusion or pneumothorax. Mild bandlike
densities in the bilateral lung bases likely due to scarring. Mild
atelectasis or scar at the right middle lobe. Nodular ground-glass
densities in the left lower lobe, for example series 3, image number
57.

Upper Abdomen: No acute abnormality. Heterogenous hypoenhancement of
the spleen with slightly rounded area posteriorly could be due to
phase of enhancement.

Musculoskeletal: No chest wall abnormality. No acute or significant
osseous findings.

Review of the MIP images confirms the above findings.
IMPRESSION: 1. Negative for acute pulmonary embolus.
2. Nodular ground-glass densities in the left lower lobe, suspect
that this is related to respiratory tract infection. These measure
up to 7 mm in size. Initial follow-up with CT at 6-12 months is
recommended to confirm persistence. If persistent, repeat CT is
recommended every 2 years until 5 years of stability has been
established. This recommendation follows the consensus statement:
Guidelines for Management of Incidental Pulmonary Nodules Detected
[DATE].
3. Mild cardiomegaly.
4. Heterogenous enhancement of the spleen with questionable rounded
area of hypoenhancement posteriorly at the site of previously
questioned abnormality, see MRI 09/20/2019.

## 2022-04-08 ENCOUNTER — Other Ambulatory Visit: Payer: Self-pay

## 2022-04-08 ENCOUNTER — Emergency Department (HOSPITAL_BASED_OUTPATIENT_CLINIC_OR_DEPARTMENT_OTHER): Payer: Self-pay

## 2022-04-08 ENCOUNTER — Encounter (HOSPITAL_BASED_OUTPATIENT_CLINIC_OR_DEPARTMENT_OTHER): Payer: Self-pay

## 2022-04-08 ENCOUNTER — Emergency Department (HOSPITAL_BASED_OUTPATIENT_CLINIC_OR_DEPARTMENT_OTHER)
Admission: EM | Admit: 2022-04-08 | Discharge: 2022-04-09 | Disposition: A | Discharge status: Home / Self Care | Payer: Self-pay | Attending: Student | Admitting: Student

## 2022-04-08 DIAGNOSIS — M791 Myalgia, unspecified site: Secondary | ICD-10-CM

## 2022-04-08 DIAGNOSIS — F17210 Nicotine dependence, cigarettes, uncomplicated: Secondary | ICD-10-CM | POA: Insufficient documentation

## 2022-04-08 DIAGNOSIS — Z79899 Other long term (current) drug therapy: Secondary | ICD-10-CM | POA: Insufficient documentation

## 2022-04-08 DIAGNOSIS — J449 Chronic obstructive pulmonary disease, unspecified: Secondary | ICD-10-CM | POA: Insufficient documentation

## 2022-04-08 DIAGNOSIS — E119 Type 2 diabetes mellitus without complications: Secondary | ICD-10-CM | POA: Insufficient documentation

## 2022-04-08 DIAGNOSIS — I10 Essential (primary) hypertension: Secondary | ICD-10-CM | POA: Insufficient documentation

## 2022-04-08 DIAGNOSIS — B338 Other specified viral diseases: Secondary | ICD-10-CM

## 2022-04-08 MED ORDER — KETOROLAC TROMETHAMINE 15 MG/ML IJ SOLN
15.0000 mg | Freq: Once | INTRAMUSCULAR | Status: AC
  Administered 2022-04-08: 15 mg via INTRAMUSCULAR
  Filled 2022-04-08: qty 1

## 2022-04-08 MED ORDER — IPRATROPIUM-ALBUTEROL 0.5-2.5 (3) MG/3ML IN SOLN
3.0000 mL | Freq: Once | RESPIRATORY_TRACT | Status: AC
  Administered 2022-04-08: 3 mL via RESPIRATORY_TRACT
  Filled 2022-04-08: qty 3

## 2022-04-08 NOTE — ED Triage Notes (Signed)
Pt reports she tested positive for RSV on 12/27 at [facility]. She has not felt better after taking OTC meds. She still reports body aches, cough and feeling bad.

## 2022-04-08 NOTE — ED Provider Notes (Signed)
[facility] EMERGENCY DEPARTMENT Provider Note  CSN: [id number] Arrival date & time: 04/08/22 2255  Chief Complaint(s) Generalized Body Aches  HPI Grace Collier is a 47 y.o. female with PMH T2DM, COPD who presents emergency department for evaluation of generalized bodyaches, myalgias and mild shortness of breath.  Patient was diagnosed with RSV at [facility] on 04/03/2022 and she states that she has been taking over-the-counter medicines like Halls, Vicks vapor rub etc. but is not using any Tylenol or NSAIDs at home.  She endorses some mild shortness of breath with exertion and wheezing.  She does endorse using her inhalers at home.  She currently denies chest pain, abdominal pain, nausea, vomiting, headache or other systemic symptoms.   Past Medical History Past Medical History:  Diagnosis Date   COPD (chronic obstructive pulmonary disease) ([place])    Diabetes mellitus without complication ([place])    Patient Active Problem List   Diagnosis Date Noted   Symptomatic anemia 09/17/2019   Upper abdominal pain 09/17/2019   Type 2 diabetes mellitus, without long-term current use of insulin ([place]) 12/10/2017   Left upper lobe pulmonary nodule 11/20/2017   COPD (chronic obstructive pulmonary disease) ([place]) 11/19/2017   History of tobacco abuse 11/19/2017   Essential hypertension 11/18/2017   Home Medication(s) Prior to Admission medications   Medication Sig Start Date End Date Taking? Authorizing Provider  albuterol (VENTOLIN HFA) 108 (90 Base) MCG/ACT inhaler Inhale 2 puffs into the lungs every 6 (six) hours as needed for wheezing or shortness of breath. 04/09/20   Pokhrel, Corrie Mckusick, MD  Aspirin-Salicylamide-Caffeine (BC HEADACHE PO) Take 1 packet by mouth daily as needed (headache).    [provider]  doxycycline (VIBRA-TABS) 100 MG tablet Take 1 tablet (100 mg total) by mouth every 12 (twelve) hours. 04/09/20   Pokhrel, Corrie Mckusick, MD  famotidine (PEPCID) 20 MG tablet Take  1 tablet (20 mg total) by mouth 2 (two) times daily. 04/23/21   Prosperi, Christian H, PA-C  hydrochlorothiazide (MICROZIDE) 12.5 MG capsule Take 1 capsule (12.5 mg total) by mouth daily. 04/09/20 04/09/21  Pokhrel, Corrie Mckusick, MD  nicotine (NICODERM CQ - DOSED IN MG/24 HOURS) 14 mg/24hr patch Place 1 patch (14 mg total) onto the skin daily. 04/10/20   Pokhrel, Corrie Mckusick, MD  pantoprazole (PROTONIX) 40 MG tablet Take 1 tablet (40 mg total) by mouth daily before breakfast. 04/09/20   Pokhrel, Corrie Mckusick, MD  polyethylene glycol (MIRALAX / GLYCOLAX) 17 g packet Take 17 g by mouth daily as needed for moderate constipation or severe constipation. 04/09/20   Pokhrel, Corrie Mckusick, MD  sucralfate (CARAFATE) 1 g tablet Take 1 tablet (1 g total) by mouth 4 (four) times daily -  with meals and at bedtime. 04/23/21   Prosperi, Joesph Fillers, PA-C  Past Surgical History History reviewed. No pertinent surgical history. Family History History reviewed. No pertinent family history.  Social History Social History   Tobacco Use   Smoking status: Every Day    Types: Cigarettes   Smokeless tobacco: Never  Vaping Use   Vaping Use: Never used  Substance Use Topics   Alcohol use: Never   Drug use: Not Currently   Allergies Patient has no known allergies.  Review of Systems Review of Systems  Constitutional:  Positive for chills and fatigue.  Respiratory:  Positive for shortness of breath.   Musculoskeletal:  Positive for arthralgias and myalgias.    Physical Exam Vital Signs  I have reviewed the triage vital signs BP (!) 154/72 (BP Location: Right Arm)   Pulse (!) 109   Temp 98.9 F (37.2 C) (Oral)   Resp 18   Ht 5\' 2"  (1.575 m)   Wt 84.8 kg   LMP 03/20/2022 (Approximate)   SpO2 100%   BMI 34.20 kg/m   Physical Exam Vitals and nursing note reviewed.  Constitutional:      General: She is  not in acute distress.    Appearance: She is well-developed.  HENT:     Head: Normocephalic and atraumatic.  Eyes:     Conjunctiva/sclera: Conjunctivae normal.  Cardiovascular:     Rate and Rhythm: Normal rate and regular rhythm.     Heart sounds: No murmur heard. Pulmonary:     Effort: Pulmonary effort is normal. No respiratory distress.     Breath sounds: Wheezing present.  Abdominal:     Palpations: Abdomen is soft.     Tenderness: There is no abdominal tenderness.  Musculoskeletal:        General: No swelling.     Cervical back: Neck supple.  Skin:    General: Skin is warm and dry.     Capillary Refill: Capillary refill takes less than 2 seconds.  Neurological:     Mental Status: She is alert.  Psychiatric:        Mood and Affect: Mood normal.     ED Results and Treatments Labs (all labs ordered are listed, but only abnormal results are displayed) Labs Reviewed - No data to display                                                                                                                        Radiology No results found.  Pertinent labs & imaging results that were available during my care of the patient were reviewed by me and considered in my medical decision making (see MDM for details).  Medications Ordered in ED Medications  ketorolac (TORADOL) 15 MG/ML injection 15 mg (has no administration in time range)  ipratropium-albuterol (DUONEB) 0.5-2.5 (3) MG/3ML nebulizer solution 3 mL (has no administration in time range)  Procedures Procedures  (including critical care time)  Medical Decision Making / ED Course   This patient presents to the ED for concern of myalgias, cough, this involves an extensive number of treatment options, and is a complaint that carries with it a high risk of complications and morbidity.  The  differential diagnosis includes RSV, pneumonia, COPD exacerbation  MDM: Patient seen emerged part for evaluation of myalgias and cough in the setting of a known RSV infection.  Physical exam with some faint wheezing but is otherwise unremarkable.  Chest x-ray with no evidence of pneumonia.  Patient received a single DuoNeb and intramuscular Toradol and on reevaluation symptoms improved.  Patient presentation consistent with current RSV infection and patient was placed on an NSAID regimen with naproxen with Tylenol for breakthrough.  Patient then discharged with outpatient follow-up.  Patient very stable at time of discharge with some very mild tachycardia which is not unusual in the setting of an active RSV infection   Additional history obtained:  -External records from outside source obtained and reviewed including: Chart review including previous notes, labs, imaging, consultation notes   Lab Tests: -I ordered, reviewed, and interpreted labs.   The pertinent results include:   Labs Reviewed - No data to display   Imaging Studies ordered: I ordered imaging studies including CXR I independently visualized and interpreted imaging. I agree with the radiologist interpretation   Medicines ordered and prescription drug management: Meds ordered this encounter  Medications   ketorolac (TORADOL) 15 MG/ML injection 15 mg   ipratropium-albuterol (DUONEB) 0.5-2.5 (3) MG/3ML nebulizer solution 3 mL    -I have reviewed the patients home medicines and have made adjustments as needed  Critical interventions none   Cardiac Monitoring: The patient was maintained on a cardiac monitor.  I personally viewed and interpreted the cardiac monitored which showed an underlying rhythm of: NSR, sinus tachycardia  Social Determinants of Health:  Factors impacting patients care include: none   Reevaluation: After the interventions noted above, I reevaluated the patient and found that they have  :improved  Co morbidities that complicate the patient evaluation  Past Medical History:  Diagnosis Date   COPD (chronic obstructive pulmonary disease) ([facility])    Diabetes mellitus without complication ([facility])       Dispostion: I considered admission for this patient, but at this time she does not meet inpatient criteria for admission she is safe for discharge with outpatient follow-up     Final Clinical Impression(s) / ED Diagnoses Final diagnoses:  None     @PCDICTATION @    Luverta Korte, , MD 04/09/22 0013

## 2022-04-09 MED ORDER — ACETAMINOPHEN 500 MG PO TABS: 1000.0000 mg | ORAL_TABLET | Freq: Three times a day (TID) | ORAL | 0 refills | Status: AC | PRN

## 2022-04-09 MED ORDER — NAPROXEN 375 MG PO TABS: 375.0000 mg | ORAL_TABLET | Freq: Two times a day (BID) | ORAL | 0 refills | Status: DC

## 2022-04-09 MED ORDER — NAPROXEN 375 MG PO TABS: 375.0000 mg | ORAL_TABLET | Freq: Two times a day (BID) | ORAL | 0 refills | Status: AC

## 2022-04-09 MED ORDER — ACETAMINOPHEN 500 MG PO TABS: 1000.0000 mg | ORAL_TABLET | Freq: Three times a day (TID) | ORAL | 0 refills | Status: DC | PRN

## 2022-12-10 ENCOUNTER — Other Ambulatory Visit: Payer: Self-pay

## 2022-12-10 ENCOUNTER — Encounter (HOSPITAL_BASED_OUTPATIENT_CLINIC_OR_DEPARTMENT_OTHER): Payer: Self-pay | Admitting: Emergency Medicine

## 2022-12-10 ENCOUNTER — Emergency Department (HOSPITAL_BASED_OUTPATIENT_CLINIC_OR_DEPARTMENT_OTHER)
Admission: EM | Admit: 2022-12-10 | Discharge: 2022-12-10 | Disposition: A | Discharge status: Home / Self Care | Payer: Self-pay | Attending: Emergency Medicine | Admitting: Emergency Medicine

## 2022-12-10 ENCOUNTER — Emergency Department (HOSPITAL_BASED_OUTPATIENT_CLINIC_OR_DEPARTMENT_OTHER): Payer: Self-pay

## 2022-12-10 DIAGNOSIS — Z7951 Long term (current) use of inhaled steroids: Secondary | ICD-10-CM | POA: Insufficient documentation

## 2022-12-10 DIAGNOSIS — Z7982 Long term (current) use of aspirin: Secondary | ICD-10-CM | POA: Insufficient documentation

## 2022-12-10 DIAGNOSIS — J449 Chronic obstructive pulmonary disease, unspecified: Secondary | ICD-10-CM | POA: Insufficient documentation

## 2022-12-10 DIAGNOSIS — N644 Mastodynia: Secondary | ICD-10-CM | POA: Insufficient documentation

## 2022-12-10 DIAGNOSIS — E119 Type 2 diabetes mellitus without complications: Secondary | ICD-10-CM | POA: Insufficient documentation

## 2022-12-10 LAB — CBC WITH DIFFERENTIAL/PLATELET
Abs Immature Granulocytes: 0.03 10*3/uL (ref 0.00–0.07)
Basophils Absolute: 0 10*3/uL (ref 0.0–0.1)
Basophils Relative: 0 %
Eosinophils Absolute: 0 10*3/uL (ref 0.0–0.5)
Eosinophils Relative: 0 %
HCT: 26.8 % — ABNORMAL LOW (ref 36.0–46.0)
Hemoglobin: 7.6 g/dL — ABNORMAL LOW (ref 12.0–15.0)
Immature Granulocytes: 0 %
Lymphocytes Relative: 14 %
Lymphs Abs: 0.9 10*3/uL (ref 0.7–4.0)
MCH: 20 pg — ABNORMAL LOW (ref 26.0–34.0)
MCHC: 28.4 g/dL — ABNORMAL LOW (ref 30.0–36.0)
MCV: 70.5 fL — ABNORMAL LOW (ref 80.0–100.0)
Monocytes Absolute: 0.5 10*3/uL (ref 0.1–1.0)
Monocytes Relative: 7 %
Neutro Abs: 5.3 10*3/uL (ref 1.7–7.7)
Neutrophils Relative %: 79 %
Platelets: 137 10*3/uL — ABNORMAL LOW (ref 150–400)
RBC: 3.8 MIL/uL — ABNORMAL LOW (ref 3.87–5.11)
RDW: 19.8 % — ABNORMAL HIGH (ref 11.5–15.5)
WBC: 6.8 10*3/uL (ref 4.0–10.5)
nRBC: 0 % (ref 0.0–0.2)

## 2022-12-10 LAB — BASIC METABOLIC PANEL
Anion gap: 8 (ref 5–15)
BUN: 7 mg/dL (ref 6–20)
CO2: 28 mmol/L (ref 22–32)
Calcium: 9 mg/dL (ref 8.9–10.3)
Chloride: 103 mmol/L (ref 98–111)
Creatinine, Ser: 0.72 mg/dL (ref 0.44–1.00)
GFR, Estimated: >60 mL/min (ref >60)
Glucose, Bld: 103 mg/dL — ABNORMAL HIGH (ref 70–99)
Potassium: 3.3 mmol/L — ABNORMAL LOW (ref 3.5–5.1)
Sodium: 139 mmol/L (ref 135–145)

## 2022-12-10 MED ORDER — IOHEXOL 300 MG/ML  SOLN
75.0000 mL | Freq: Once | INTRAMUSCULAR | Status: AC | PRN
  Administered 2022-12-10: 75 mL via INTRAVENOUS

## 2022-12-10 MED ORDER — NAPROXEN 500 MG PO TABS: 500.0000 mg | ORAL_TABLET | Freq: Two times a day (BID) | ORAL | 0 refills | Status: AC

## 2022-12-10 MED ORDER — CEPHALEXIN 500 MG PO CAPS: 500.0000 mg | ORAL_CAPSULE | Freq: Four times a day (QID) | ORAL | 0 refills | Status: AC

## 2022-12-10 MED ORDER — HYDROCODONE-ACETAMINOPHEN 5-325 MG PO TABS
1.0000 | ORAL_TABLET | Freq: Once | ORAL | Status: AC
  Administered 2022-12-10: 1 via ORAL
  Filled 2022-12-10: qty 1

## 2022-12-10 MED ORDER — NAPROXEN 250 MG PO TABS
500.0000 mg | ORAL_TABLET | Freq: Once | ORAL | Status: AC
Start: 2022-12-10 — End: 2022-12-10
  Administered 2022-12-10: 500 mg via ORAL
  Filled 2022-12-10: qty 2

## 2022-12-10 MED ORDER — POTASSIUM CHLORIDE CRYS ER 20 MEQ PO TBCR
40.0000 meq | EXTENDED_RELEASE_TABLET | Freq: Once | ORAL | Status: AC
  Administered 2022-12-10: 40 meq via ORAL
  Filled 2022-12-10: qty 2

## 2022-12-10 NOTE — ED Provider Notes (Signed)
  Physical Exam  BP (!) 157/95   Pulse 78   Temp 97.7 F (36.5 C)   Resp 18   Ht 5\' 2"  (1.575 m)   Wt 84.4 kg   SpO2 97%   BMI 34.02 kg/m   Physical Exam  Procedures  Procedures  ED Course / MDM    Medical Decision Making Amount and/or Complexity of Data Reviewed Labs: ordered. Radiology: ordered.  Risk Prescription drug management.   I took over patient care at time of shift change from Jeanelle Malling, PA-C.  Patient has had soreness and pain to the left breast with a palpable bump for the past 3 days.  Denies any discharge from the breast.  Imaging was pending at time of shift change.  Rocky Link did a bedside ultrasound which did not show any fluid in the mass, so there is nothing to drain.  CT chest with contrast was ordered.  Imaging showed left breast skin thickening from the nipple extending laterally with an ill-defined density throughout the left breast parenchyma.  Findings and related to neoplasm or infection.  Recommend follow-up with the breast center with diagnostic mammogram and ultrasound.  Mildly enlarged left axilla nodes.  Splenomegaly noted.  Rounded splenic lesion in the posterior spleen noted in previous imaging in November 2022 is now enlarged.  This could be further assessed with elective MRI.  Discussed results with patient.  Prescription for Keflex sent to the pharmacy to treat for possible cellulitis.  Prescription for naproxen sent for pain relief.  Information for [facility] provided for patient to follow-up with to establish for primary care, get a referral to a breast center, and follow-up for splenic lesion.  Patient is agreeable with plan.  She is hemodynamically stable and safe for discharge home.  All questions have been answered.  Return precautions provided.     Maxwell Marion, PA-C 12/10/22 2146    Jacalyn Lefevre, MD 12/10/22 [phone]

## 2022-12-10 NOTE — ED Triage Notes (Signed)
Left breast pain soreness and pain has a lump she states has heat in it x  3 days denies d/c from breast

## 2022-12-10 NOTE — ED Provider Notes (Signed)
[facility] EM[facility]GENCY DEPARTMENT AT [facility] Provider Note   CSN: [id number] Arrival date & time: 12/10/22  1304     History  Chief Complaint  Patient presents with   Breast Pain    Grace Collier is a 47 y.o. female with past history of COPD, diabetes presents today for evaluation of breast pain.  Patient noticed the lump in her left breast 2 days ago. LMP was 12/04/2022. Since then she has has increasing pain.  Denies any skin redness.  Denies any nipple discharge. Denies fever, cold/chills.  HPI  Past Medical History:  Diagnosis Date   COPD (chronic obstructive pulmonary disease) ([facility])    Diabetes mellitus without complication ([facility])    History reviewed. No pertinent surgical history.   Home Medications Prior to Admission medications   Medication Sig Start Date End Date Taking? Authorizing Provider  acetaminophen (TYLENOL) 500 MG tablet Take 2 tablets (1,000 mg total) by mouth every 8 (eight) hours as needed. 04/09/22   Kommor, [place], MD  albuterol (VENTOLIN HFA) 108 (90 Base) MCG/ACT inhaler Inhale 2 puffs into the lungs every 6 (six) hours as needed for wheezing or shortness of breath. 04/09/20   Pokhrel, Rebekah Chesterfield, MD  Aspirin-Salicylamide-Caffeine (BC HEADACHE PO) Take 1 packet by mouth daily as needed (headache).    [provider]  doxycycline (VIBRA-TABS) 100 MG tablet Take 1 tablet (100 mg total) by mouth every 12 (twelve) hours. 04/09/20   Pokhrel, Rebekah Chesterfield, MD  famotidine (PEPCID) 20 MG tablet Take 1 tablet (20 mg total) by mouth 2 (two) times daily. 04/23/21   Prosperi, Christian H, PA-C  hydrochlorothiazide (MICROZIDE) 12.5 MG capsule Take 1 capsule (12.5 mg total) by mouth daily. 04/09/20 04/09/21  Pokhrel, Rebekah Chesterfield, MD  naproxen (NAPROSYN) 375 MG tablet Take 1 tablet (375 mg total) by mouth 2 (two) times daily. 04/09/22   Kommor, [place], MD  nicotine (NICODERM CQ - DOSED IN MG/24 HOURS) 14 mg/24hr patch Place 1 patch (14 mg total) onto the skin daily. 04/10/20    Pokhrel, Rebekah Chesterfield, MD  pantoprazole (PROTONIX) 40 MG tablet Take 1 tablet (40 mg total) by mouth daily before breakfast. 04/09/20   Pokhrel, Rebekah Chesterfield, MD  polyethylene glycol (MIRALAX / GLYCOLAX) 17 g packet Take 17 g by mouth daily as needed for moderate constipation or severe constipation. 04/09/20   Pokhrel, Rebekah Chesterfield, MD  sucralfate (CARAFATE) 1 g tablet Take 1 tablet (1 g total) by mouth 4 (four) times daily -  with meals and at bedtime. 04/23/21   Prosperi, Christian H, PA-C      Allergies    Patient has no known allergies.    Review of Systems   Review of Systems Negative except as per HPI.  Physical Exam Updated Vital Signs BP (!) 176/90   Pulse 77   Temp 97.7 F (36.5 C)   Resp 18   Ht 5\' 2"  (1.575 m)   Wt 84.4 kg   SpO2 98%   BMI 34.02 kg/m  Physical Exam Vitals and nursing note reviewed.  Constitutional:      Appearance: Normal appearance.  HENT:     Head: Normocephalic and atraumatic.     Mouth/Throat:     Mouth: Mucous membranes are moist.  Eyes:     General: No scleral icterus. Cardiovascular:     Rate and Rhythm: Normal rate and regular rhythm.     Pulses: Normal pulses.     Heart sounds: Normal heart sounds.  Pulmonary:     Effort: Pulmonary effort is  normal.     Breath sounds: Normal breath sounds.  Abdominal:     General: Abdomen is flat.     Palpations: Abdomen is soft.     Tenderness: There is no abdominal tenderness.  Musculoskeletal:        General: No deformity.     Comments: Breast exam done with RN present throughout examination.  Palpable 7 x 7 cm rubbery smooth lump on the left breast.  No overlying skin erythema.  POC ED ultrasound showed no evidence of drainable pocket of fluid.  Unlikely abscess.  Skin:    General: Skin is warm.     Findings: No rash.  Neurological:     General: No focal deficit present.     Mental Status: She is alert.  Psychiatric:        Mood and Affect: Mood normal.     ED Results / Procedures / Treatments    Labs (all labs ordered are listed, but only abnormal results are displayed) Labs Reviewed  CBC WITH DIFFERENTIAL/PLATELET - Abnormal; Notable for the following components:      Result Value   RBC 3.80 (*)    Hemoglobin 7.6 (*)    HCT 26.8 (*)    MCV 70.5 (*)    MCH 20.0 (*)    MCHC 28.4 (*)    RDW 19.8 (*)    Platelets 137 (*)    All other components within normal limits  BASIC METABOLIC PANEL - Abnormal; Notable for the following components:   Potassium 3.3 (*)    Glucose, Bld 103 (*)    All other components within normal limits    EKG None  Radiology No results found.  Procedures Ultrasound ED Soft Tissue  Date/Time: 12/10/2022 6:49 PM  Performed by: Jeanelle Malling, PA Authorized by: Jeanelle Malling, PA   Procedure details:    Indications: localization of abscess     Transverse view:  Visualized   Longitudinal view:  Visualized   Images: not archived   Location:    Location: chest     Side:  Left Findings:     no abscess present    no cellulitis present    no foreign body present     Medications Ordered in ED Medications  naproxen (NAPROSYN) tablet 500 mg (500 mg Oral Given 12/10/22 1709)    ED Course/ Medical Decision Making/ A&P                                 Medical Decision Making Amount and/or Complexity of Data Reviewed Labs: ordered. Radiology: ordered.  Risk Prescription drug management.   This patient presents to the ED for breast mass, this involves an extensive number of treatment options, and is a complaint that carries with a high risk of complications and morbidity.  The differential diagnosis includes mastitis, malignancy, mastalgia, fibroadenoma, fibrocystic breast, breast abscess.  This is not an exhaustive list.  Lab tests: I ordered and personally interpreted labs.  The pertinent results include: WBC unremarkable. Hbg 7.6 around baseline. Platelets unremarkable. Electrolytes K 3.3. BUN, creatinine unremarkable.   Imaging studies: I ordered  imaging studies, personally reviewed, interpreted imaging and agree with the radiologist's interpretations. The results include: CT chest with contrast ordered and pending.  ED POC ultrasound done by this EDP showed no evidence of drainable pocket of fluid.  Problem list/ ED course/ Critical interventions/ Medical management: HPI: See above Vital signs within normal range  and stable throughout visit. Laboratory/imaging studies significant for: See above. On physical examination, patient is afebrile and appears in no acute distress.  There was tenderness to palpation to the left breast with a palpable approximately 7 x 7 cm mass on the left breast.  No overlying skin erythema. I have reviewed the patient home medicines and have made adjustments as needed.  Cardiac monitoring/EKG: The patient was maintained on a cardiac monitor.  I personally reviewed and interpreted the cardiac monitor which showed an underlying rhythm of: sinus rhythm.  Additional history obtained: External records from outside source obtained and reviewed including: Chart review including previous notes, labs, imaging.  Consultations obtained:  Disposition Continued outpatient therapy. Follow-up with PCP recommended for reevaluation of symptoms. Treatment plan discussed with patient.  Pt acknowledged understanding was agreeable to the plan. Worrisome signs and symptoms were discussed with patient, and patient acknowledged understanding to return to the ED if they noticed these signs and symptoms. Patient was stable upon discharge.   This chart was dictated using voice recognition software.  Despite best efforts to proofread,  errors can occur which can change the documentation meaning.          Final Clinical Impression(s) / ED Diagnoses Final diagnoses:  Breast pain    Rx / DC Orders ED Discharge Orders     None         Jeanelle Malling, [place] 12/10/22 1850    Jacalyn Lefevre, MD 12/10/22 2130

## 2022-12-10 NOTE — ED Notes (Signed)
Pt has returned to their room 

## 2022-12-10 NOTE — Discharge Instructions (Addendum)
As discussed, your CT showed skin thickening and the mass that you are feeling in your breast. The radiologist recommend following up with a breast clinic for mammogram and ultrasound.   I have sent in a prescription for Keflex to your emergency.  Take this 4 times a day for 5 days to treat for breast infection.  I have also a prescription of Naproxen, you can take this twice a day as needed for pain.  I have given you information for [facility]. Give them a call tomorrow to follow up. You can establish there to get a primary care doctor and they will help get you scheduled with a breast center.  Additionally, your imaging showed of splenic lesion that has grown in size since your previous imaging in 2022.  Outpatient MRI is recommended, you can do this with [facility] as well.  Return to ED if: symptoms worsens
